# Patient Record
Sex: Female | Born: 1945 | Race: White | Hispanic: No | Marital: Married | Smoking: Former smoker
Health system: Southern US, Community
[De-identification: ages and names within clinical notes are randomized; demographics above are authoritative.]

## PROBLEM LIST (undated history)

## (undated) DIAGNOSIS — Z923 Personal history of irradiation: Secondary | ICD-10-CM

## (undated) DIAGNOSIS — Z1211 Encounter for screening for malignant neoplasm of colon: Secondary | ICD-10-CM

## (undated) DIAGNOSIS — L409 Psoriasis, unspecified: Secondary | ICD-10-CM

## (undated) DIAGNOSIS — J45909 Unspecified asthma, uncomplicated: Secondary | ICD-10-CM

## (undated) DIAGNOSIS — M810 Age-related osteoporosis without current pathological fracture: Secondary | ICD-10-CM

## (undated) DIAGNOSIS — I10 Essential (primary) hypertension: Secondary | ICD-10-CM

## (undated) DIAGNOSIS — M8580 Other specified disorders of bone density and structure, unspecified site: Secondary | ICD-10-CM

## (undated) DIAGNOSIS — E785 Hyperlipidemia, unspecified: Secondary | ICD-10-CM

## (undated) DIAGNOSIS — E119 Type 2 diabetes mellitus without complications: Secondary | ICD-10-CM

## (undated) DIAGNOSIS — C50919 Malignant neoplasm of unspecified site of unspecified female breast: Secondary | ICD-10-CM

## (undated) DIAGNOSIS — J449 Chronic obstructive pulmonary disease, unspecified: Secondary | ICD-10-CM

## (undated) HISTORY — DX: Chronic obstructive pulmonary disease, unspecified: J44.9

## (undated) HISTORY — DX: Unspecified asthma, uncomplicated: J45.909

## (undated) HISTORY — DX: Malignant neoplasm of unspecified site of unspecified female breast: C50.919

## (undated) HISTORY — DX: Encounter for screening for malignant neoplasm of colon: Z12.11

## (undated) HISTORY — PX: APPENDECTOMY: SHX54

## (undated) HISTORY — PX: ABDOMINAL HYSTERECTOMY: SHX81

## (undated) HISTORY — PX: BREAST BIOPSY: SHX20

## (undated) HISTORY — PX: TUBAL LIGATION: SHX77

## (undated) HISTORY — DX: Essential (primary) hypertension: I10

## (undated) HISTORY — DX: Hyperlipidemia, unspecified: E78.5

## (undated) HISTORY — DX: Type 2 diabetes mellitus without complications: E11.9

## (undated) HISTORY — DX: Psoriasis, unspecified: L40.9

## (undated) HISTORY — DX: Age-related osteoporosis without current pathological fracture: M81.0

## (undated) HISTORY — DX: Other specified disorders of bone density and structure, unspecified site: M85.80

---

## 1982-08-16 HISTORY — PX: CHOLECYSTECTOMY: SHX55

## 1982-08-16 HISTORY — PX: OTHER SURGICAL HISTORY: SHX169

## 2004-01-15 DIAGNOSIS — G500 Trigeminal neuralgia: Secondary | ICD-10-CM | POA: Insufficient documentation

## 2004-09-02 ENCOUNTER — Encounter: Payer: Self-pay | Admitting: Specialist

## 2004-09-15 ENCOUNTER — Ambulatory Visit: Payer: Self-pay | Admitting: Internal Medicine

## 2004-09-16 ENCOUNTER — Encounter: Payer: Self-pay | Admitting: Specialist

## 2004-10-14 ENCOUNTER — Encounter: Payer: Self-pay | Admitting: Specialist

## 2004-11-14 ENCOUNTER — Encounter: Payer: Self-pay | Admitting: Specialist

## 2004-12-14 ENCOUNTER — Encounter: Payer: Self-pay | Admitting: Specialist

## 2004-12-17 ENCOUNTER — Ambulatory Visit: Payer: Self-pay

## 2005-01-14 ENCOUNTER — Encounter: Payer: Self-pay | Admitting: Specialist

## 2005-01-19 ENCOUNTER — Ambulatory Visit: Payer: Self-pay | Admitting: Internal Medicine

## 2005-02-13 ENCOUNTER — Encounter: Payer: Self-pay | Admitting: Specialist

## 2005-03-16 ENCOUNTER — Encounter: Payer: Self-pay | Admitting: Specialist

## 2005-04-16 ENCOUNTER — Encounter: Payer: Self-pay | Admitting: Specialist

## 2005-05-28 ENCOUNTER — Ambulatory Visit: Payer: Self-pay | Admitting: Internal Medicine

## 2005-12-20 ENCOUNTER — Ambulatory Visit: Payer: Self-pay | Admitting: Internal Medicine

## 2006-01-17 ENCOUNTER — Ambulatory Visit: Payer: Self-pay | Admitting: Internal Medicine

## 2006-03-18 ENCOUNTER — Ambulatory Visit: Payer: Self-pay | Admitting: Gastroenterology

## 2006-04-27 ENCOUNTER — Ambulatory Visit: Payer: Self-pay | Admitting: Internal Medicine

## 2006-08-16 HISTORY — PX: COLONOSCOPY: SHX174

## 2006-11-08 ENCOUNTER — Ambulatory Visit: Payer: Self-pay | Admitting: Internal Medicine

## 2006-12-09 ENCOUNTER — Encounter: Payer: Self-pay | Admitting: General Practice

## 2006-12-15 ENCOUNTER — Encounter: Payer: Self-pay | Admitting: General Practice

## 2007-04-24 ENCOUNTER — Ambulatory Visit: Payer: Self-pay | Admitting: Unknown Physician Specialty

## 2007-08-30 ENCOUNTER — Ambulatory Visit: Payer: Self-pay | Admitting: Specialist

## 2007-11-08 ENCOUNTER — Ambulatory Visit: Payer: Self-pay | Admitting: Internal Medicine

## 2008-05-30 ENCOUNTER — Ambulatory Visit: Payer: Self-pay | Admitting: Internal Medicine

## 2008-06-11 ENCOUNTER — Ambulatory Visit: Payer: Self-pay | Admitting: Unknown Physician Specialty

## 2008-07-08 ENCOUNTER — Ambulatory Visit: Payer: Self-pay | Admitting: Internal Medicine

## 2008-07-22 ENCOUNTER — Ambulatory Visit: Payer: Self-pay | Admitting: Internal Medicine

## 2008-07-23 ENCOUNTER — Ambulatory Visit: Payer: Self-pay | Admitting: Internal Medicine

## 2008-07-26 ENCOUNTER — Ambulatory Visit: Payer: Self-pay | Admitting: Internal Medicine

## 2008-08-08 ENCOUNTER — Ambulatory Visit: Payer: Self-pay | Admitting: Internal Medicine

## 2008-09-09 ENCOUNTER — Ambulatory Visit: Payer: Self-pay | Admitting: Rheumatology

## 2008-09-09 ENCOUNTER — Ambulatory Visit: Payer: Self-pay | Admitting: Internal Medicine

## 2009-05-13 ENCOUNTER — Ambulatory Visit: Payer: Self-pay | Admitting: Internal Medicine

## 2009-08-20 ENCOUNTER — Ambulatory Visit: Payer: Self-pay | Admitting: Unknown Physician Specialty

## 2010-05-13 ENCOUNTER — Other Ambulatory Visit: Payer: Self-pay | Admitting: Internal Medicine

## 2010-07-28 ENCOUNTER — Ambulatory Visit: Payer: Self-pay | Admitting: Unknown Physician Specialty

## 2011-12-24 ENCOUNTER — Other Ambulatory Visit: Payer: Self-pay | Admitting: Internal Medicine

## 2011-12-24 LAB — CBC WITH DIFFERENTIAL/PLATELET
Basophil %: 0.8 %
Eosinophil %: 4.4 %
HCT: 38.4 % (ref 35.0–47.0)
HGB: 12.9 g/dL (ref 12.0–16.0)
Lymphocyte #: 1.8 10*3/uL (ref 1.0–3.6)
Lymphocyte %: 35.8 %
MCHC: 33.6 g/dL (ref 32.0–36.0)
Neutrophil %: 51.8 %
RBC: 3.98 10*6/uL (ref 3.80–5.20)
RDW: 13.4 % (ref 11.5–14.5)
WBC: 5.1 10*3/uL (ref 3.6–11.0)

## 2011-12-24 LAB — LIPID PANEL
Cholesterol: 171 mg/dL (ref 0–200)
Ldl Cholesterol, Calc: 92 mg/dL (ref 0–100)
Triglycerides: 188 mg/dL (ref 0–200)
VLDL Cholesterol, Calc: 38 mg/dL (ref 5–40)

## 2011-12-24 LAB — URINALYSIS, COMPLETE
Bacteria: NONE SEEN
Blood: NEGATIVE
Ketone: NEGATIVE
Leukocyte Esterase: NEGATIVE
Nitrite: NEGATIVE
Squamous Epithelial: <1

## 2011-12-24 LAB — COMPREHENSIVE METABOLIC PANEL
Albumin: 3.7 g/dL (ref 3.4–5.0)
BUN: 17 mg/dL (ref 7–18)
Chloride: 105 mmol/L (ref 98–107)
Co2: 30 mmol/L (ref 21–32)
EGFR (African American): >60
EGFR (Non-African Amer.): >60
SGOT(AST): 27 U/L (ref 15–37)
SGPT (ALT): 30 U/L
Sodium: 141 mmol/L (ref 136–145)

## 2011-12-24 LAB — BILIRUBIN, DIRECT: Bilirubin, Direct: <0.1 mg/dL (ref 0.00–0.20)

## 2013-01-18 ENCOUNTER — Ambulatory Visit: Payer: Self-pay | Admitting: Internal Medicine

## 2013-02-01 ENCOUNTER — Ambulatory Visit: Payer: Self-pay | Admitting: Internal Medicine

## 2013-02-13 ENCOUNTER — Ambulatory Visit: Payer: Self-pay | Admitting: Internal Medicine

## 2013-08-16 DIAGNOSIS — Z923 Personal history of irradiation: Secondary | ICD-10-CM

## 2013-08-16 HISTORY — PX: BREAST BIOPSY: SHX20

## 2013-08-16 HISTORY — DX: Personal history of irradiation: Z92.3

## 2013-08-16 HISTORY — PX: BREAST LUMPECTOMY: SHX2

## 2014-05-13 ENCOUNTER — Ambulatory Visit: Payer: Self-pay | Admitting: Internal Medicine

## 2014-05-16 ENCOUNTER — Ambulatory Visit: Payer: Self-pay | Admitting: Internal Medicine

## 2014-05-21 LAB — PULMONARY FUNCTION TEST

## 2014-05-23 ENCOUNTER — Other Ambulatory Visit: Payer: Self-pay | Admitting: General Surgery

## 2014-05-23 ENCOUNTER — Ambulatory Visit: Payer: Commercial Managed Care - PPO

## 2014-05-23 ENCOUNTER — Ambulatory Visit (INDEPENDENT_AMBULATORY_CARE_PROVIDER_SITE_OTHER): Payer: Commercial Managed Care - PPO | Admitting: General Surgery

## 2014-05-23 ENCOUNTER — Encounter: Payer: Self-pay | Admitting: General Surgery

## 2014-05-23 VITALS — BP 110/78 | HR 74 | Resp 14 | Ht 65.0 in | Wt 199.0 lb

## 2014-05-23 DIAGNOSIS — N6310 Unspecified lump in the right breast, unspecified quadrant: Secondary | ICD-10-CM

## 2014-05-23 DIAGNOSIS — R928 Other abnormal and inconclusive findings on diagnostic imaging of breast: Secondary | ICD-10-CM

## 2014-05-23 DIAGNOSIS — N63 Unspecified lump in breast: Secondary | ICD-10-CM

## 2014-05-23 NOTE — Patient Instructions (Signed)
Patient to return in 1 week for nurse check. Continue self breast exams. Call office for any new breast issues or concerns.     CARE AFTER BREAST BIOPSY  1. Leave the dressing on that your doctor applied after surgery. It is waterproof. You may bathe, shower and/or swim. The dressing will probably remain intact until your return office visit. If the dressing comes off, you will see small strips of tape against your skin on the incision. Do not remove these strips.  2. You may want to use a gauze,cloth or similar protection in your bra to prevent rubbing against your dressing and incision. This is not necessary, but you may feel more comfortable doing so.  3. It is recommended that you wear a bra day and night to give support to the breast. This will prevent the weight of the breast from pulling on the incision.  4. Your breast will feel hard and lumpy under the incision. Do not be alarmed. This is the underlying stitching of tissue. Softening of this tissue will occur in time.  5. Make sure you call the office and schedule an appointment in one week after your surgery. The office phone number is [phone]. The [facility] may have already done this for you.  6. You will notice about a week after your office visit that the strips of the tape on your incision will begin to loosen. These may then be removed.  7. Report to your doctor any of the following:  * Severe pain not relieved by your pain medication  *Redness of the incision  * Drainage from the incision  *Fever greater than 101 degrees

## 2014-05-23 NOTE — Progress Notes (Signed)
Patient ID: Laine DYANI BABEL, female   DOB: 12-Apr-1946, 68 y.o.   MRN: [id number]  Chief Complaint  Patient presents with  . Other    New Pt evaluation of right breast mass    HPI Rozina H Wieand is a 68 y.o. female who presents for an evaluation of an abnormal mammogram. Her most recent mammogram was performed on 05/16/14 as well as a right breast ultrasound. The patient performs self breast checks on occasion and her last mammogram prior to this one was done 4 years ago. (The patient reports that after 2011 mammograms she had decided against any additional studies until her PCP laid down the law earlier this year). She denies any problems with the breasts. She has had prior breast biopsies that were benign. The only known family history of breast cancer was a paternal grandmother.  The patient is accompanied today by Ranelle Oyster, RN, who works in the [facility] radiation oncology department.  HPI  Past Medical History  Diagnosis Date  . Hypertension   . Diabetes mellitus without complication   . Hyperlipidemia   . Osteopenia   . COPD (chronic obstructive pulmonary disease)   . Osteoporosis   . Asthma     Past Surgical History  Procedure Laterality Date  . Breast biopsy Bilateral 1980's  . Carpel tunnel Bilateral 1984  . Abdominal hysterectomy  1970's    partial  . Cholecystectomy  1984  . Tubal ligation      Family History  Problem Relation Age of Onset  . Pancreatitis Father   . Cancer Paternal Grandmother     breast    Social History History  Substance Use Topics  . Smoking status: Former Smoker -- 1.00 packs/day for 40 years  . Smokeless tobacco: Never Used  . Alcohol Use: No    Allergies  Allergen Reactions  . Other Anaphylaxis    "Yellow Dye"  . Codeine Nausea And Vomiting    Current Outpatient Prescriptions  Medication Sig Dispense Refill  . Calcium Carbonate-Vit D-Min (CALTRATE 600+D PLUS PO) Take by mouth.      . EPINEPHrine 0.3 mg/0.3 mL IJ SOAJ injection  Inject 0.3 mg into the muscle once.      . hydrochlorothiazide (HYDRODIURIL) 25 MG tablet Take 25 mg by mouth daily.      Marland Kitchen lovastatin (MEVACOR) 40 MG tablet Take 40 mg by mouth at bedtime.      . meloxicam (MOBIC) 15 MG tablet Take 15 mg by mouth daily as needed for pain.      . metFORMIN (GLUCOPHAGE) 500 MG tablet Take 500 mg by mouth 2 (two) times daily with a meal.      . metoprolol tartrate (LOPRESSOR) 25 MG tablet Take 25 mg by mouth 2 (two) times daily.      Marland Kitchen tiotropium (SPIRIVA) 18 MCG inhalation capsule Place 18 mcg into inhaler and inhale daily.      Marland Kitchen venlafaxine XR (EFFEXOR-XR) 150 MG 24 hr capsule Take 150 mg by mouth daily with breakfast.       No current facility-administered medications for this visit.    Review of Systems Review of Systems  Constitutional: Negative.   Respiratory: Negative.   Cardiovascular: Negative.     Blood pressure 110/78, pulse 74, resp. rate 14, height 5\' 5"  (1.651 m), weight 199 lb (90.266 kg).  Physical Exam Physical Exam  Constitutional: She is oriented to person, place, and time. She appears well-developed and well-nourished.  Neck: Neck supple. No thyromegaly present.  Cardiovascular: Normal rate, regular rhythm and normal heart sounds.   No murmur heard. Pulmonary/Chest: Effort normal and breath sounds normal. Right breast exhibits no inverted nipple, no mass, no nipple discharge, no skin change and no tenderness. Left breast exhibits no inverted nipple, no mass, no nipple discharge, no skin change and no tenderness.  Lymphadenopathy:    She has no cervical adenopathy.    She has no axillary adenopathy.  Neurological: She is alert and oriented to person, place, and time.  Skin: Skin is warm and dry.    Data Reviewed Screening mammogram dated 05/13/2014 suggested a mass in the right breast for which additional views and ultrasound were recommended.  Focal spot compression views and ultrasound dated 05/16/2014 showed a persistent  nodular density in the upper-outer quadrant of the right breast. A second dense in the 6:00 clear with compression and was felt unchanged. Ultrasound showed a hypoechoic irregular nodule corresponding to the mammographic findings. BI-RAD-4.  Review of the 2015 as well as 2011 mammogram suggests in retrospect a small area present in 2011 measuring 0.23 x 0.38 x 0.44. On mammogram in 2015 this area measured 0.46 x 0.50 x 0.72. Modest change in 4 years.  The patient was most interested in having an FNA. Considering the small size of lesion and the 5-10% likelihood of a false negative report I encouraged her to consider a vacuum biopsy. She was amenable to this.  Ultrasound examination showed a 0.34 x 0.28 x 0.42 hypoechoic nodule that was tall that was wide at the 9:00 position of the right breast 7 cm from the nipple. This corresponded to the [facility] study.  9 cc of 0.25% Marcaine with 0.5% Xylocaine with 1 200,000 units of epinephrine was utilized well tolerated. Chlor prep was applied to the skin. A 14-gauge Finesse device was passed under ultrasound guidance through the lesion. Pre-and post-fire images were obtained. Approximately 8 core samples were obtained. Scant bleeding was noted. A postbiopsy clip was placed. Skin defect was closed with benzoin Steri-Strips followed by Telfa and Tegaderm dressing.  Written instructions for wound care were provided.  Pulmonary function testing dated 05/21/2014 showed FVC of 81% of predicted, FEV1 and 62% of predicted. PFTs were obtained because of reports of mild increase in dyspnea with exertion. Negative catheterization May 2014.  PCP noticed it 04/09/2014 were reviewed. Assessment    Developing nodule upper-outer quadrant of the right breast.    Plan    The patient is aware that because of blade our biopsy results will likely not be available until October 12. She will be contacted when they are available.    PCP/Ref. MD: Arther Dames, Judene Companion 05/23/2014, 8:46 PM

## 2014-05-27 ENCOUNTER — Encounter: Payer: Self-pay | Admitting: General Surgery

## 2014-05-27 ENCOUNTER — Ambulatory Visit (INDEPENDENT_AMBULATORY_CARE_PROVIDER_SITE_OTHER): Payer: Commercial Managed Care - PPO | Admitting: General Surgery

## 2014-05-27 VITALS — BP 150/82 | HR 78 | Resp 16 | Ht 65.0 in | Wt 197.0 lb

## 2014-05-27 DIAGNOSIS — C50411 Malignant neoplasm of upper-outer quadrant of right female breast: Secondary | ICD-10-CM

## 2014-05-27 NOTE — Progress Notes (Signed)
Patient ID: Sandra Burnett, female   DOB: 01-26-1946, 68 y.o.   MRN: [id number]  Chief Complaint  Patient presents with  . Follow-up    breast discussion    HPI Sandra Burnett is a 68 y.o. female who presents for a breast discussion.   Patient here today for follow up post breast biopsy.  Dressing removed, steristrip in place and aware it may come off in one week.  Minimal bruising noted.  The patient is aware that a heating pad may be used for comfort as needed.  Aware of pathology. Follow up as scheduled.  HPI  Past Medical History  Diagnosis Date  . Hypertension   . Diabetes mellitus without complication   . Hyperlipidemia   . Osteopenia   . COPD (chronic obstructive pulmonary disease)   . Osteoporosis   . Asthma     Past Surgical History  Procedure Laterality Date  . Breast biopsy Bilateral 1980's  . Carpel tunnel Bilateral 1984  . Abdominal hysterectomy  1970's    partial  . Cholecystectomy  1984  . Tubal ligation      Family History  Problem Relation Age of Onset  . Pancreatitis Father   . Cancer Paternal Grandmother     breast    Social History History  Substance Use Topics  . Smoking status: Former Smoker -- 1.00 packs/day for 40 years  . Smokeless tobacco: Never Used  . Alcohol Use: No    Allergies  Allergen Reactions  . Other Anaphylaxis    "Yellow Dye"  . Codeine Nausea And Vomiting    Current Outpatient Prescriptions  Medication Sig Dispense Refill  . Calcium Carbonate-Vit D-Min (CALTRATE 600+D PLUS PO) Take by mouth.      . EPINEPHrine 0.3 mg/0.3 mL IJ SOAJ injection Inject 0.3 mg into the muscle once.      . hydrochlorothiazide (HYDRODIURIL) 25 MG tablet Take 25 mg by mouth daily.      Marland Kitchen lovastatin (MEVACOR) 40 MG tablet Take 40 mg by mouth at bedtime.      . meloxicam (MOBIC) 15 MG tablet Take 15 mg by mouth daily as needed for pain.      . metFORMIN (GLUCOPHAGE) 500 MG tablet Take 500 mg by mouth 2 (two) times daily with a meal.      .  metoprolol tartrate (LOPRESSOR) 25 MG tablet Take 25 mg by mouth 2 (two) times daily.      Marland Kitchen tiotropium (SPIRIVA) 18 MCG inhalation capsule Place 18 mcg into inhaler and inhale daily.      Marland Kitchen venlafaxine XR (EFFEXOR-XR) 150 MG 24 hr capsule Take 150 mg by mouth daily with breakfast.       No current facility-administered medications for this visit.    Review of Systems Review of Systems  Constitutional: Negative.   Respiratory: Negative.   Cardiovascular: Negative.     Blood pressure 150/82, pulse 78, resp. rate 16, height 5\' 5"  (1.651 m), weight 197 lb (89.359 kg).  Physical Exam Physical Exam  Data Reviewed A 6 mm invasive mammary carcinoma. No specific type. Receptor status pending.  Assessment    Stage I, T1b carcinoma of the right breast.     Plan    Options for management were reviewed in detail. Breast conservation and mastectomy were present it is therapeutic we will look procedures. The roll for post wide excision radiation therapy was discussed. Option for second surgical opinion were reviewed. An informational brochure was parotid. Proximally 45 minutes was spent  reviewing her surgical options. At this time she is leaning strongly toward breast conservation.     PCP/Ref Md: Ocie Cornfield 05/28/2014, 8:23 PM

## 2014-05-28 ENCOUNTER — Other Ambulatory Visit: Payer: Self-pay | Admitting: General Surgery

## 2014-05-28 ENCOUNTER — Telehealth: Payer: Self-pay

## 2014-05-28 DIAGNOSIS — Z170 Estrogen receptor positive status [ER+]: Secondary | ICD-10-CM

## 2014-05-28 DIAGNOSIS — C50411 Malignant neoplasm of upper-outer quadrant of right female breast: Secondary | ICD-10-CM | POA: Insufficient documentation

## 2014-05-28 NOTE — Telephone Encounter (Signed)
Spoke with patient about scheduling her breast surgery. Patient is scheduled for surgery at [facility] on 06/13/14. She will pre admit by phone on 06/04/14. She will report to the radiology desk on 06/13/14 at 7:45 am. Patient has been mailed her surgery instructions. She is aware of dates, time, and instructions.

## 2014-05-30 LAB — IMMUNOHISTOCHEMICAL STAIN(S)

## 2014-05-30 LAB — PATHOLOGY

## 2014-06-06 ENCOUNTER — Ambulatory Visit: Payer: Self-pay | Admitting: Anesthesiology

## 2014-06-06 LAB — BASIC METABOLIC PANEL
ANION GAP: 6 — AB (ref 7–16)
BUN: 20 mg/dL — ABNORMAL HIGH (ref 7–18)
CALCIUM: 9.1 mg/dL (ref 8.5–10.1)
CREATININE: 0.86 mg/dL (ref 0.60–1.30)
Chloride: 103 mmol/L (ref 98–107)
Co2: 30 mmol/L (ref 21–32)
EGFR (Non-African Amer.): >60
GLUCOSE: 184 mg/dL — AB (ref 65–99)
Osmolality: 285 (ref 275–301)
Potassium: 3.3 mmol/L — ABNORMAL LOW (ref 3.5–5.1)
Sodium: 139 mmol/L (ref 136–145)

## 2014-06-08 ENCOUNTER — Telehealth: Payer: Self-pay | Admitting: General Surgery

## 2014-06-08 NOTE — Telephone Encounter (Signed)
Mildly low potassium. Will supplement with KCl 10 mEq tablets 1 by mouth twice a day. RX is called to [facility] per patient request.  The patient report she says and right lower quadrant pain the last few days. No fever or chills. No change in appetite or diet. Noted this after discontinuing Mobic per hospital instructions. Reports she's had trouble with fried foods in the past (status post cholecystectomy 1984). When she's had episodes she is attributed this to diverticulitis. Will examine at time of her October 26 office visit, she will call earlier if symptoms worsen.

## 2014-06-10 ENCOUNTER — Ambulatory Visit: Payer: Commercial Managed Care - PPO

## 2014-06-10 ENCOUNTER — Ambulatory Visit (INDEPENDENT_AMBULATORY_CARE_PROVIDER_SITE_OTHER): Payer: Commercial Managed Care - PPO | Admitting: General Surgery

## 2014-06-10 ENCOUNTER — Encounter: Payer: Self-pay | Admitting: General Surgery

## 2014-06-10 VITALS — BP 140/80 | HR 72 | Resp 14 | Ht 65.0 in | Wt 198.0 lb

## 2014-06-10 DIAGNOSIS — G8929 Other chronic pain: Secondary | ICD-10-CM | POA: Insufficient documentation

## 2014-06-10 DIAGNOSIS — C50411 Malignant neoplasm of upper-outer quadrant of right female breast: Secondary | ICD-10-CM

## 2014-06-10 DIAGNOSIS — E876 Hypokalemia: Secondary | ICD-10-CM

## 2014-06-10 DIAGNOSIS — R1031 Right lower quadrant pain: Secondary | ICD-10-CM

## 2014-06-10 NOTE — Patient Instructions (Signed)
The patient is aware to call back for any questions or concerns.  

## 2014-06-10 NOTE — Progress Notes (Signed)
Patient ID: Sandra Burnett, female   DOB: Dec 24, 1945, 68 y.o.   MRN: [id number]  Chief Complaint  Patient presents with  . Follow-up    Right breast ultrasound for pre surgery    HPI Sandra Burnett is a 68 y.o. female who presents for a right breast ultrasound for pre surgery. The patient is doing well. No new complaints at this time.   The patient complains of right lower quadrant pain that started approximately 2 weeks ago. This has been in the lower abdomen extending from near the anterior superior iliac spine towards the midline above the level of the inguinal ligament. It has not been associated with nausea, vomiting or diarrhea. No change in bladder function. She reports the symptoms are similar to what she experienced years ago with ovarian cysts. A last few days it has significantly improved. She is experienced no fever or chills associated with this discomfort. It is intermittent in nature.  HPI  Past Medical History  Diagnosis Date  . Hypertension   . Diabetes mellitus without complication   . Hyperlipidemia   . Osteopenia   . COPD (chronic obstructive pulmonary disease)   . Osteoporosis   . Asthma     Past Surgical History  Procedure Laterality Date  . Breast biopsy Bilateral 1980's  . Carpel tunnel Bilateral 1984  . Abdominal hysterectomy  1970's    partial  . Cholecystectomy  1984  . Tubal ligation      Family History  Problem Relation Age of Onset  . Pancreatitis Father   . Cancer Paternal Grandmother     breast    Social History History  Substance Use Topics  . Smoking status: Former Smoker -- 1.00 packs/day for 40 years  . Smokeless tobacco: Never Used  . Alcohol Use: No    Allergies  Allergen Reactions  . Other Anaphylaxis    "Yellow Dye"  . Codeine Nausea And Vomiting    Current Outpatient Prescriptions  Medication Sig Dispense Refill  . Calcium Carbonate-Vit D-Min (CALTRATE 600+D PLUS PO) Take by mouth.      . EPINEPHrine 0.3 mg/0.3 mL IJ  SOAJ injection Inject 0.3 mg into the muscle once.      . hydrochlorothiazide (HYDRODIURIL) 25 MG tablet Take 25 mg by mouth daily.      Marland Kitchen lovastatin (MEVACOR) 40 MG tablet Take 40 mg by mouth at bedtime.      . meloxicam (MOBIC) 15 MG tablet Take 15 mg by mouth daily as needed for pain.      . metFORMIN (GLUCOPHAGE) 500 MG tablet Take 500 mg by mouth 2 (two) times daily with a meal.      . metoprolol tartrate (LOPRESSOR) 25 MG tablet Take 25 mg by mouth 2 (two) times daily.      . potassium chloride (K-DUR) 10 MEQ tablet Take 10 mEq by mouth daily.      Marland Kitchen tiotropium (SPIRIVA) 18 MCG inhalation capsule Place 18 mcg into inhaler and inhale daily.      Marland Kitchen venlafaxine XR (EFFEXOR-XR) 150 MG 24 hr capsule Take 150 mg by mouth daily with breakfast.       No current facility-administered medications for this visit.    Review of Systems Review of Systems  Constitutional: Negative.   Respiratory: Negative.   Cardiovascular: Negative.     Blood pressure 140/80, pulse 72, resp. rate 14, height 5\' 5"  (1.651 m), weight 198 lb (89.812 kg).  Physical Exam Physical Exam  Constitutional: She is oriented  to person, place, and time. She appears well-developed and well-nourished.  Cardiovascular: Normal rate, regular rhythm and normal heart sounds.   No murmur heard. Pulmonary/Chest: Effort normal and breath sounds normal.    Abdominal: Soft. Normal appearance and bowel sounds are normal. There is no hepatosplenomegaly. There is no tenderness. No hernia.    Neurological: She is alert and oriented to person, place, and time.  Skin: Skin is warm and dry.    Data Reviewed Ultrasound examination of the right breast was undertaken to confirm the previously identified site of malignancy can be localized. At the 9:00 position of the right breast 8 cm from the nipple a hypoechoic mass with dense shadowing measuring 0.5 x 0.81 x 0.86 cm is identified.  Scanning to the axilla showed no adenopathy.  BI-RADS-6.  Assessment    Right breast cancer, stage I. Desires breast conservation.  Right lower quadrant pain, no clear etiology.  Mild hypokalemia (3.2) noted on preoperative testing. 10 mEq KCl tablets twice a day initiated 2 days ago.    Plan    Plans for breast conservation were reviewed. Potential for partial breast radiation briefly touched upon.  We'll postpone any further investigation of the right lower quadrant pain as she has shown significant improvement over the last 48-72 hours.  We will plan to proceed with wide excision and sentinel node biopsy on 1029.     PCP/Ref MD: Arther Dames, Trecia Rogers 06/10/2014, 8:27 PM

## 2014-06-13 ENCOUNTER — Encounter: Payer: Self-pay | Admitting: General Surgery

## 2014-06-13 ENCOUNTER — Ambulatory Visit: Payer: Self-pay | Admitting: General Surgery

## 2014-06-13 DIAGNOSIS — C50411 Malignant neoplasm of upper-outer quadrant of right female breast: Secondary | ICD-10-CM

## 2014-06-13 HISTORY — PX: BREAST SURGERY: SHX581

## 2014-06-17 ENCOUNTER — Encounter: Payer: Self-pay | Admitting: General Surgery

## 2014-06-17 ENCOUNTER — Telehealth: Payer: Self-pay | Admitting: General Surgery

## 2014-06-17 NOTE — Telephone Encounter (Signed)
Notified margins and SLN clear.  Reports significant pain first few days, now improving. Follow up on November 4th as planned.

## 2014-06-18 ENCOUNTER — Encounter: Payer: Self-pay | Admitting: General Surgery

## 2014-06-18 ENCOUNTER — Ambulatory Visit: Payer: Commercial Managed Care - PPO | Admitting: General Surgery

## 2014-06-18 ENCOUNTER — Other Ambulatory Visit (INDEPENDENT_AMBULATORY_CARE_PROVIDER_SITE_OTHER): Payer: Commercial Managed Care - PPO

## 2014-06-18 VITALS — BP 114/78 | HR 76 | Temp 98.5°F | Resp 14 | Ht 65.0 in | Wt 201.0 lb

## 2014-06-18 DIAGNOSIS — C50411 Malignant neoplasm of upper-outer quadrant of right female breast: Secondary | ICD-10-CM

## 2014-06-18 DIAGNOSIS — T888XXA Other specified complications of surgical and medical care, not elsewhere classified, initial encounter: Secondary | ICD-10-CM

## 2014-06-18 DIAGNOSIS — T792XXA Traumatic secondary and recurrent hemorrhage and seroma, initial encounter: Secondary | ICD-10-CM

## 2014-06-18 DIAGNOSIS — IMO0002 Reserved for concepts with insufficient information to code with codable children: Secondary | ICD-10-CM | POA: Insufficient documentation

## 2014-06-18 DIAGNOSIS — IMO0001 Reserved for inherently not codable concepts without codable children: Secondary | ICD-10-CM

## 2014-06-18 MED ORDER — SULFAMETHOXAZOLE-TRIMETHOPRIM 800-160 MG PO TABS: 1.0000 | ORAL_TABLET | Freq: Two times a day (BID) | ORAL | Status: DC

## 2014-06-18 NOTE — Progress Notes (Signed)
Patient ID: Sandra Burnett, female   DOB: 1946-07-03, 68 y.o.   MRN: [id number]  Chief Complaint  Patient presents with  . Routine Post Op    HPI Sandra Burnett is a 68 y.o. female here today for her post op right breast wide excision done on 06/13/14. Patient states the area is red and swollen. She states she had fever 99.5 Saturday. She also states that her blood sugar was in the 200's this weekend. Redness is less today than noted over the weekend by Ranelle Oyster, RN from Radiation Oncology who accompanied the patient today.   HPI  Past Medical History  Diagnosis Date  . Hypertension   . Diabetes mellitus without complication   . Hyperlipidemia   . Osteopenia   . COPD (chronic obstructive pulmonary disease)   . Osteoporosis   . Asthma     Past Surgical History  Procedure Laterality Date  . Carpel tunnel Bilateral 1984  . Abdominal hysterectomy  1970's    partial  . Cholecystectomy  1984  . Tubal ligation    . Breast biopsy Bilateral 1980's  . Breast surgery Right 06/13/14    right breast wide excision    Family History  Problem Relation Age of Onset  . Pancreatitis Father   . Cancer Paternal Grandmother     breast    Social History History  Substance Use Topics  . Smoking status: Former Smoker -- 1.00 packs/day for 40 years  . Smokeless tobacco: Never Used  . Alcohol Use: No    Allergies  Allergen Reactions  . Other Anaphylaxis    "Yellow Dye"  . Codeine Nausea And Vomiting    Current Outpatient Prescriptions  Medication Sig Dispense Refill  . Calcium Carbonate-Vit D-Min (CALTRATE 600+D PLUS PO) Take by mouth.    . EPINEPHrine 0.3 mg/0.3 mL IJ SOAJ injection Inject 0.3 mg into the muscle once.    . hydrochlorothiazide (HYDRODIURIL) 25 MG tablet Take 25 mg by mouth daily.    Marland Kitchen HYDROcodone-acetaminophen (NORCO/VICODIN) 5-325 MG per tablet Take 2 tablets by mouth every 6 (six) hours as needed.   0  . lovastatin (MEVACOR) 40 MG tablet Take 40 mg by mouth at  bedtime.    . meloxicam (MOBIC) 15 MG tablet Take 15 mg by mouth daily as needed for pain.    . metFORMIN (GLUCOPHAGE) 500 MG tablet Take 500 mg by mouth 2 (two) times daily with a meal.    . metoprolol tartrate (LOPRESSOR) 25 MG tablet Take 25 mg by mouth 2 (two) times daily.    . potassium chloride (K-DUR) 10 MEQ tablet Take 10 mEq by mouth daily.    Marland Kitchen tiotropium (SPIRIVA) 18 MCG inhalation capsule Place 18 mcg into inhaler and inhale daily.    Marland Kitchen venlafaxine XR (EFFEXOR-XR) 150 MG 24 hr capsule Take 150 mg by mouth daily with breakfast.    . sulfamethoxazole-trimethoprim (BACTRIM DS,SEPTRA DS) 800-160 MG per tablet Take 1 tablet by mouth 2 (two) times daily. 20 tablet 0   No current facility-administered medications for this visit.    Review of Systems Review of Systems  Blood pressure 114/78, pulse 76, temperature 98.5 F (36.9 C), temperature source Oral, resp. rate 14, height 5\' 5"  (1.651 m), weight 201 lb (91.173 kg).  Physical Exam Physical Exam The right breast shows minimal warmth in area of mastoplasty. Minimal redness secondary to flap elevation.  Data Reviewed Margins negative. Nodes negative.  Ultrasound shows a 2.5 x 3. 6.5 cm seroma. Aspiration  returned 60 cc brown fluid consistent with blood/ seroma. Culture obtained.   Assessment    Seroma, possible infection.    Plan    The patient will be placed on Bactrim DS, 1 po BID pending culture results.  Follow up one week.  She will arrange for radiation oncology assessment prior to her next appointment.       PCP:  Earvin Hansen 06/18/2014, 8:36 PM

## 2014-06-18 NOTE — Patient Instructions (Signed)
Continue self breast exams. Call office for any new breast issues or concerns. 

## 2014-06-19 ENCOUNTER — Ambulatory Visit: Payer: Commercial Managed Care - PPO | Admitting: General Surgery

## 2014-06-20 ENCOUNTER — Telehealth: Payer: Self-pay | Admitting: *Deleted

## 2014-06-20 MED ORDER — CEFADROXIL 500 MG PO CAPS: 500.0000 mg | ORAL_CAPSULE | Freq: Two times a day (BID) | ORAL | Status: DC

## 2014-06-20 NOTE — Telephone Encounter (Signed)
Mammosite schedule reviewed with the patient Placement   06-25-14  11:45  at [facility] Scan 06-28-14 Treat 11-16 to 07-05-14 Aware Abigail Butts will be calling her for more details Aware of ATB and directions reviewed. Aware no showers and to wear her bra while mammosite in place. Pt agrees.

## 2014-06-21 ENCOUNTER — Ambulatory Visit: Payer: Self-pay | Admitting: Radiation Oncology

## 2014-06-23 LAB — ANAEROBIC AND AEROBIC CULTURE

## 2014-06-25 ENCOUNTER — Ambulatory Visit: Payer: Commercial Managed Care - PPO | Admitting: General Surgery

## 2014-06-25 ENCOUNTER — Other Ambulatory Visit: Payer: Commercial Managed Care - PPO

## 2014-06-25 ENCOUNTER — Ambulatory Visit (INDEPENDENT_AMBULATORY_CARE_PROVIDER_SITE_OTHER): Payer: Commercial Managed Care - PPO | Admitting: General Surgery

## 2014-06-25 ENCOUNTER — Encounter: Payer: Self-pay | Admitting: General Surgery

## 2014-06-25 VITALS — BP 136/74 | HR 74 | Resp 12 | Ht 65.0 in | Wt 199.0 lb

## 2014-06-25 DIAGNOSIS — C50411 Malignant neoplasm of upper-outer quadrant of right female breast: Secondary | ICD-10-CM

## 2014-06-25 NOTE — Progress Notes (Signed)
Patient ID: Lashanti EVANN KOELZER, female   DOB: 1946/06/26, 68 y.o.   MRN: [id number]  Chief Complaint  Patient presents with  . Procedure    right mammosite    HPI Lashawn ANALYSSE QUINONEZ is a 68 y.o. female here today for a right mammosite placement. The patient has met with Carmina Miller, MD from radiation oncology and desires to proceed with partial breast radiation. The previously noted restin the surgical site prior to aspiration at her last visit has significantly improved. Only mild tenderness persists in the breasts. HPI  Past Medical History  Diagnosis Date  . Hypertension   . Diabetes mellitus without complication   . Hyperlipidemia   . Osteopenia   . COPD (chronic obstructive pulmonary disease)   . Osteoporosis   . Asthma     Past Surgical History  Procedure Laterality Date  . Carpel tunnel Bilateral 1984  . Abdominal hysterectomy  1970's    partial  . Cholecystectomy  1984  . Tubal ligation    . Breast biopsy Bilateral 1980's  . Breast surgery Right 06/13/14    right breast wide excision    Family History  Problem Relation Age of Onset  . Pancreatitis Father   . Cancer Paternal Grandmother     breast    Social History History  Substance Use Topics  . Smoking status: Former Smoker -- 1.00 packs/day for 40 years  . Smokeless tobacco: Never Used  . Alcohol Use: No    Allergies  Allergen Reactions  . Other Anaphylaxis    "Yellow Dye"  . Codeine Nausea And Vomiting    Current Outpatient Prescriptions  Medication Sig Dispense Refill  . Calcium Carbonate-Vit D-Min (CALTRATE 600+D PLUS PO) Take by mouth.    . cefadroxil (DURICEF) 500 MG capsule Take 1 capsule (500 mg total) by mouth 2 (two) times daily. 22 capsule 0  . EPINEPHrine 0.3 mg/0.3 mL IJ SOAJ injection Inject 0.3 mg into the muscle once.    . hydrochlorothiazide (HYDRODIURIL) 25 MG tablet Take 25 mg by mouth daily.    Marland Kitchen HYDROcodone-acetaminophen (NORCO/VICODIN) 5-325 MG per tablet Take 2 tablets by mouth  every 6 (six) hours as needed.   0  . lovastatin (MEVACOR) 40 MG tablet Take 40 mg by mouth at bedtime.    . meloxicam (MOBIC) 15 MG tablet Take 15 mg by mouth daily as needed for pain.    . metFORMIN (GLUCOPHAGE) 500 MG tablet Take 500 mg by mouth 2 (two) times daily with a meal.    . metoprolol tartrate (LOPRESSOR) 25 MG tablet Take 25 mg by mouth 2 (two) times daily.    . potassium chloride (K-DUR) 10 MEQ tablet Take 10 mEq by mouth daily.    Marland Kitchen sulfamethoxazole-trimethoprim (BACTRIM DS,SEPTRA DS) 800-160 MG per tablet Take 1 tablet by mouth 2 (two) times daily. 20 tablet 0  . tiotropium (SPIRIVA) 18 MCG inhalation capsule Place 18 mcg into inhaler and inhale daily.    Marland Kitchen venlafaxine XR (EFFEXOR-XR) 150 MG 24 hr capsule Take 150 mg by mouth daily with breakfast.     No current facility-administered medications for this visit.    Review of Systems Review of Systems  Constitutional: Negative.   Respiratory: Negative.   Cardiovascular: Negative.     Blood pressure 136/74, pulse 74, resp. rate 12, height 5\' 5"  (1.651 m), weight 199 lb (90.266 kg).  Physical Exam Physical Exam Near complete resolution of the previously identified erythema. No induration. Incisions healing well.  Data Reviewed Culture  obtained at last visit with no growth at 48 hours. The patient has continued Bactrim as requested for planned MammoSite balloon placement today.  Assessment    Candidate for partial breast radiation.     Plan    Ultrasound examination again showed an adequate cavity approximately 1.5 cm below the skin surface. The breast was prepped with ChloraPrep and 10 mL of 0.5% Xylocaine with 0.25% Marcaine with 1-200,000 epinephrine was utilized well tolerated. ChloraPrep was again applied to the skin and the area draped. A small incision was made laterally followed by placement of an 8 mm trocar. Approximately 30 mL of old seroma fluid drained. No odor.  The cavity evaluation device was inserted  and good space preservation noted without evidence of additional residual seroma. This was then removed and the treatment balloon placed and inflated to 50 mL with a mixture of saline and Omnipaque. This was well-tolerated. The minimal skin distance post inflation was 1.49 cm. The balloon was a spherical both pre-and post implantation. The exit site was treated with bacitracin ointment followed by dry dressing.  The patient was instructed in regards to postplacement wound care. Simulation is scheduled for November 13 and treatment through the week of November 16.  We'll plan for follow-up examination here in 3 weeks.        Robert Bellow 06/25/2014, 8:35 PM

## 2014-06-25 NOTE — Patient Instructions (Signed)
Patient care kit given to patient.  Instructed no showers, sponge bath while mammosite in place, take antibiotic. Follow up with [facility] as arranged. Discussed wearing your bra for support at all times. 

## 2014-07-16 ENCOUNTER — Ambulatory Visit: Payer: Self-pay | Admitting: Oncology

## 2014-07-18 ENCOUNTER — Encounter: Payer: Self-pay | Admitting: General Surgery

## 2014-07-18 ENCOUNTER — Ambulatory Visit (INDEPENDENT_AMBULATORY_CARE_PROVIDER_SITE_OTHER): Payer: Self-pay | Admitting: General Surgery

## 2014-07-18 VITALS — BP 130/74 | HR 74 | Resp 12 | Ht 65.0 in | Wt 203.0 lb

## 2014-07-18 DIAGNOSIS — C50411 Malignant neoplasm of upper-outer quadrant of right female breast: Secondary | ICD-10-CM

## 2014-07-18 NOTE — Patient Instructions (Signed)
Patient to return in April 2016 for breast check.

## 2014-07-18 NOTE — Progress Notes (Signed)
Patient ID: Sandra Burnett, female   DOB: 12-04-1945, 68 y.o.   MRN: [id number]  Chief Complaint  Patient presents with  . Follow-up    mammosite    HPI Sandra Burnett is a 68 y.o. female here today for her 3 week follow up mammosite. Patient states she is doing well.   She tolerated her partial breast radiation well. She had noticed some fullness post balloon removal which she reports is improving. HPI  Past Medical History  Diagnosis Date  . Hypertension   . Diabetes mellitus without complication   . Hyperlipidemia   . Osteopenia   . COPD (chronic obstructive pulmonary disease)   . Osteoporosis   . Asthma     Past Surgical History  Procedure Laterality Date  . Carpel tunnel Bilateral 1984  . Abdominal hysterectomy  1970's    partial  . Cholecystectomy  1984  . Tubal ligation    . Breast biopsy Bilateral 1980's  . Breast surgery Right 06/13/14    right breast wide excision    Family History  Problem Relation Age of Onset  . Pancreatitis Father   . Cancer Paternal Grandmother     breast    Social History History  Substance Use Topics  . Smoking status: Former Smoker -- 1.00 packs/day for 40 years  . Smokeless tobacco: Never Used  . Alcohol Use: No    Allergies  Allergen Reactions  . Other Anaphylaxis    "Yellow Dye"  . Codeine Nausea And Vomiting    Current Outpatient Prescriptions  Medication Sig Dispense Refill  . Calcium Carbonate-Vit D-Min (CALTRATE 600+D PLUS PO) Take by mouth.    . cefadroxil (DURICEF) 500 MG capsule Take 1 capsule (500 mg total) by mouth 2 (two) times daily. 22 capsule 0  . EPINEPHrine 0.3 mg/0.3 mL IJ SOAJ injection Inject 0.3 mg into the muscle once.    . hydrochlorothiazide (HYDRODIURIL) 25 MG tablet Take 25 mg by mouth daily.    Marland Kitchen HYDROcodone-acetaminophen (NORCO/VICODIN) 5-325 MG per tablet Take 2 tablets by mouth every 6 (six) hours as needed.   0  . lovastatin (MEVACOR) 40 MG tablet Take 40 mg by mouth at bedtime.    .  meloxicam (MOBIC) 15 MG tablet Take 15 mg by mouth daily as needed for pain.    . metFORMIN (GLUCOPHAGE) 500 MG tablet Take 500 mg by mouth 2 (two) times daily with a meal.    . metoprolol tartrate (LOPRESSOR) 25 MG tablet Take 25 mg by mouth 2 (two) times daily.    . potassium chloride (K-DUR) 10 MEQ tablet Take 10 mEq by mouth daily.    Marland Kitchen sulfamethoxazole-trimethoprim (BACTRIM DS,SEPTRA DS) 800-160 MG per tablet Take 1 tablet by mouth 2 (two) times daily. 20 tablet 0  . tiotropium (SPIRIVA) 18 MCG inhalation capsule Place 18 mcg into inhaler and inhale daily.    Marland Kitchen venlafaxine XR (EFFEXOR-XR) 150 MG 24 hr capsule Take 150 mg by mouth daily with breakfast.     No current facility-administered medications for this visit.    Review of Systems Review of Systems  Constitutional: Negative.   Respiratory: Negative.   Cardiovascular: Negative.     Blood pressure 130/74, pulse 74, resp. rate 12, height 5\' 5"  (1.651 m), weight 203 lb (92.08 kg).  Physical Exam Physical Exam  Constitutional: She is oriented to person, place, and time. She appears well-developed and well-nourished.  Eyes: Conjunctivae are normal. No scleral icterus.  Neck: Neck supple.  Cardiovascular: Normal rate,  regular rhythm and normal heart sounds.   Pulmonary/Chest: Effort normal and breath sounds normal.    Right breast incision looks clean and healing well.   Neurological: She is alert and oriented to person, place, and time.  Skin: Skin is dry.    Data Reviewed Ultrasound examination of the wide excision site showed a seroma cavity measuring 1.0 x 2.4 x 4.0 cm. The patient was amenable to aspiration. This was completed using 1 mL of Xylocaine after ChloraPrep application. 5 mL of clear fluid was obtained with complete resolution of the seroma. Modest improvement in the fullness noted in the breast.  Assessment    Post excision/radiation changes, minimal residual seroma.    Plan    The patient has been  encouraged to make use of local heat to the breast to improve the residual swelling. Care with heating pad use was encouraged.   Patient to return  In April 2016 for a breast check.     Robert Bellow 07/18/2014, 9:30 PM

## 2014-08-15 ENCOUNTER — Encounter: Payer: Self-pay | Admitting: General Surgery

## 2014-08-15 ENCOUNTER — Ambulatory Visit (INDEPENDENT_AMBULATORY_CARE_PROVIDER_SITE_OTHER): Payer: Commercial Managed Care - PPO | Admitting: General Surgery

## 2014-08-15 VITALS — BP 130/72 | HR 78 | Resp 14 | Ht 65.0 in | Wt 203.0 lb

## 2014-08-15 DIAGNOSIS — L97909 Non-pressure chronic ulcer of unspecified part of unspecified lower leg with unspecified severity: Secondary | ICD-10-CM | POA: Insufficient documentation

## 2014-08-15 DIAGNOSIS — L97919 Non-pressure chronic ulcer of unspecified part of right lower leg with unspecified severity: Secondary | ICD-10-CM

## 2014-08-15 MED ORDER — SILVER SULFADIAZINE 1 % EX CREA: TOPICAL_CREAM | CUTANEOUS | Status: DC

## 2014-08-15 NOTE — Patient Instructions (Signed)
The patient is aware to call back for any questions or concerns.  

## 2014-08-15 NOTE — Progress Notes (Signed)
Patient ID: Sandra Burnett, female   DOB: 1946/02/21, 68 y.o.   MRN: [id number]  Chief Complaint  Patient presents with  . Other    leg ulcer    HPI Sandra Burnett is a 68 y.o. female here today for a evaluation of a leg ulcer. She states this area started approximately 1 month ago. The area is red and scabbed over area where the blister was.  The patient was placed on Bactrim in early November when she developed some faint redness at her breast wide excision site.  Cultures were negative.  HPI  Past Medical History  Diagnosis Date  . Hypertension   . Diabetes mellitus without complication   . Hyperlipidemia   . Osteopenia   . COPD (chronic obstructive pulmonary disease)   . Osteoporosis   . Asthma     Past Surgical History  Procedure Laterality Date  . Carpel tunnel Bilateral 1984  . Abdominal hysterectomy  1970's    partial  . Cholecystectomy  1984  . Tubal ligation    . Breast biopsy Bilateral 1980's  . Breast surgery Right 06/13/14    right breast wide excision    Family History  Problem Relation Age of Onset  . Pancreatitis Father   . Cancer Paternal Grandmother     breast    Social History History  Substance Use Topics  . Smoking status: Former Smoker -- 1.00 packs/day for 40 years  . Smokeless tobacco: Never Used  . Alcohol Use: No    Allergies  Allergen Reactions  . Other Anaphylaxis    "Yellow Dye"  . Codeine Nausea And Vomiting    Current Outpatient Prescriptions  Medication Sig Dispense Refill  . Calcium Carbonate-Vit D-Min (CALTRATE 600+D PLUS PO) Take by mouth.    . cephALEXin (KEFLEX) 500 MG capsule Take 500 mg by mouth 2 (two) times daily.    Marland Kitchen EPINEPHrine 0.3 mg/0.3 mL IJ SOAJ injection Inject 0.3 mg into the muscle once.    . hydrochlorothiazide (HYDRODIURIL) 25 MG tablet Take 25 mg by mouth daily.    Marland Kitchen lovastatin (MEVACOR) 40 MG tablet Take 40 mg by mouth at bedtime.    . metFORMIN (GLUCOPHAGE) 500 MG tablet Take 500 mg by mouth 2 (two)  times daily with a meal.    . metoprolol tartrate (LOPRESSOR) 25 MG tablet Take 25 mg by mouth 2 (two) times daily.    Marland Kitchen tiotropium (SPIRIVA) 18 MCG inhalation capsule Place 18 mcg into inhaler and inhale daily.    Marland Kitchen venlafaxine XR (EFFEXOR-XR) 150 MG 24 hr capsule Take 150 mg by mouth daily with breakfast.    . silver sulfADIAZINE (SILVADENE) 1 % cream Apply to affected area daily 50 g 1   No current facility-administered medications for this visit.    Review of Systems Review of Systems  Constitutional: Negative.   Respiratory: Negative.   Cardiovascular: Negative.     Blood pressure 130/72, pulse 78, resp. rate 14, height 5\' 5"  (1.651 m), weight 203 lb (92.08 kg).  Physical Exam Physical Exam  Cardiovascular: Normal pulses.   Musculoskeletal:       Legs: Skin:  1 cm area of errythema with ulceration on the right lateral leg.       Assessment    Superficial skin ulceration of the right lower extremity, no clear etiology.    Plan    The patient will make use of Silvadene cream with a thin film to be applied twice a day-3 times a day.  She'll give a phone report in 2 weeks if she has failed to show improvement.     PCP:  Arther Dames,  RefMD: Dr. Marvetta Gibbons, [place] Gleason 08/15/2014, 12:12 PM

## 2014-08-16 ENCOUNTER — Ambulatory Visit: Payer: Self-pay | Admitting: Oncology

## 2014-10-02 ENCOUNTER — Ambulatory Visit (INDEPENDENT_AMBULATORY_CARE_PROVIDER_SITE_OTHER): Payer: Commercial Managed Care - PPO | Admitting: General Surgery

## 2014-10-02 ENCOUNTER — Encounter: Payer: Self-pay | Admitting: General Surgery

## 2014-10-02 ENCOUNTER — Other Ambulatory Visit: Payer: Commercial Managed Care - PPO

## 2014-10-02 VITALS — BP 138/78 | HR 80 | Resp 16 | Ht 65.0 in | Wt 200.0 lb

## 2014-10-02 DIAGNOSIS — G8929 Other chronic pain: Secondary | ICD-10-CM

## 2014-10-02 DIAGNOSIS — N6310 Unspecified lump in the right breast, unspecified quadrant: Secondary | ICD-10-CM

## 2014-10-02 DIAGNOSIS — N63 Unspecified lump in breast: Secondary | ICD-10-CM

## 2014-10-02 DIAGNOSIS — R1031 Right lower quadrant pain: Secondary | ICD-10-CM | POA: Diagnosis not present

## 2014-10-02 NOTE — Progress Notes (Signed)
Patient ID: Sandra Burnett, female   DOB: [birth year], 69 y.o.   MRN: [id number]  Chief Complaint  Patient presents with  . Abdominal Pain  . Breast Problem    HPI Sandra Burnett is a 69 y.o. female.  Here today for evaluation of right breast lump that she noticed 3-4 weeks ago. She states may or may not have changed in size. Denies pain to that area.  She is also having abdominal pain. The pain is in the right lower quadrant. She noticed it last October. Over the past 2 weeks it has gotten worse. The pain is worse when she stands up. Direct pressure to the area does not necessarily exacerbate her symptoms. She is not aware of any bulge or mass in the area.  Mild nausea but no vomiting. Bowels are regular. She did have an ultrasound back in Oct at the GYN ( Dr. Defrancesco)and it was normal. She feels it maybe adhesions because when she had her colonoscopy they manipulated her colon and the pain is similar.  The patient reports that she was told and appendectomy was completed at the time of her open cholecystectomy in 1984.   HPI  Past Medical History  Diagnosis Date  . Hypertension   . Diabetes mellitus without complication   . Hyperlipidemia   . Osteopenia   . COPD (chronic obstructive pulmonary disease)   . Osteoporosis   . Asthma     Past Surgical History  Procedure Laterality Date  . Carpel tunnel Bilateral 1984  . Abdominal hysterectomy  1970's    partial  . Cholecystectomy  1984  . Tubal ligation    . Breast biopsy Bilateral 1980's  . Breast surgery Right 06/13/14    right breast wide excision  . Colonoscopy  2008    Dr Kristopher Glee  . Appendectomy      Family History  Problem Relation Age of Onset  . Pancreatitis Father   . Cancer Paternal Grandmother     breast    Social History History  Substance Use Topics  . Smoking status: Former Smoker -- 1.00 packs/day for 40 years  . Smokeless tobacco: Never Used  . Alcohol Use: No    Allergies  Allergen Reactions  .  Other Anaphylaxis    "Yellow Dye"  . Codeine Nausea And Vomiting    Current Outpatient Prescriptions  Medication Sig Dispense Refill  . Calcium Carbonate-Vit D-Min (CALTRATE 600+D PLUS PO) Take by mouth.    . EPINEPHrine 0.3 mg/0.3 mL IJ SOAJ injection Inject 0.3 mg into the muscle once.    . hydrochlorothiazide (HYDRODIURIL) 25 MG tablet Take 25 mg by mouth daily.    Marland Kitchen letrozole (FEMARA) 2.5 MG tablet Take 2.5 mg by mouth daily.    Marland Kitchen lovastatin (MEVACOR) 40 MG tablet Take 40 mg by mouth at bedtime.    . metFORMIN (GLUCOPHAGE) 500 MG tablet Take 500 mg by mouth 2 (two) times daily with a meal.    . metoprolol tartrate (LOPRESSOR) 25 MG tablet Take 25 mg by mouth 2 (two) times daily.    . silver sulfADIAZINE (SILVADENE) 1 % cream Apply to affected area daily 50 g 1  . tiotropium (SPIRIVA) 18 MCG inhalation capsule Place 18 mcg into inhaler and inhale daily.    Marland Kitchen venlafaxine XR (EFFEXOR-XR) 150 MG 24 hr capsule Take 150 mg by mouth daily with breakfast.     No current facility-administered medications for this visit.    Review of Systems Review of Systems  Constitutional: Negative.   Respiratory: Negative.   Cardiovascular: Negative.   Gastrointestinal: Positive for nausea and abdominal pain. Negative for vomiting, diarrhea and constipation.    Blood pressure 138/78, pulse 80, resp. rate 16, height 5' 5" (1.651 m), weight 200 lb (90.719 kg).  Physical Exam Physical Exam  Constitutional: She is oriented to person, place, and time. She appears well-developed and well-nourished.  Neck: Neck supple.  Cardiovascular: Normal rate, regular rhythm and normal heart sounds.   Pulmonary/Chest: Effort normal and breath sounds normal. Right breast exhibits no inverted nipple, no mass, no nipple discharge, no skin change and no tenderness. Left breast exhibits no inverted nipple, no mass, no nipple discharge, no skin change and no tenderness.    Thickening along lateral scar right breast and 1  cm area at edge of areolar.  Abdominal: Soft. Normal appearance and bowel sounds are normal. There is no tenderness. No hernia.    Well healed cholecystectomy scar, no mass.  Lymphadenopathy:    She has no cervical adenopathy.    She has no axillary adenopathy.  Neurological: She is alert and oriented to person, place, and time.  Skin: Skin is warm and dry.    Data Reviewed Ultrasound of the right breast in the area of palpable abnormality showed a 0.37 x 0.49 x 0.54 cm smoothly marginated hypoechoic nodule with no clear-cut acoustic shadowing or enhancement was noted. BI-RADS-3.  Assessment    New nodule in the right breast, likely related to surgery and partial breast radiation.  Now chronic right lower quadrant pain without clear etiology.    Plan    Aspiration of the lesion and the breast was offered if the patient was anxious. Declined at this time. We'll reassess at the time of her planned follow-up in April.  In light of the persistent pain we'll arrange for a CT of the abdomen and pelvis. The patient is aware this will not identify adhesions.    Follow up as scheduled for breast. Abd CT scan.  Patient has been scheduled for a CT abdomen/pelvis with contrast at [facility] for 10-04-14 at 10 am (arrive 9:45 am). Prep: no solids 4 hours prior but patient may have clear liquids up until exam time, pick up prep kit, hold metformin day of scan and 48 hours after and take medication list. Patient verbalizes understanding.    PCP:  Sandra Burnett  Burnett, Sandra W 10/02/2014, 8:47 PM    

## 2014-10-02 NOTE — Patient Instructions (Addendum)
Continue self breast exams. Call office for any new breast issues or concerns. Follow up as scheduled April  Patient has been scheduled for a CT abdomen/pelvis with contrast at [facility] for 10-04-14 at 10 am (arrive 9:45 am). Prep: no solids 4 hours prior but patient may have clear liquids up until exam time, pick up prep kit, hold metformin day of scan and 48 hours after and take medication list. Patient verbalizes understanding.

## 2014-10-04 ENCOUNTER — Ambulatory Visit: Payer: Self-pay | Admitting: General Surgery

## 2014-10-07 ENCOUNTER — Encounter: Payer: Self-pay | Admitting: General Surgery

## 2014-10-08 ENCOUNTER — Telehealth: Payer: Self-pay | Admitting: General Surgery

## 2014-10-08 NOTE — Telephone Encounter (Signed)
I notified the patient that I reviewed her CT scan of the abdomen and pelvis dated 10/04/2014. The exam was obtained for her complaints of right lower quadrant pain and discomfort. Her clinical exam had been negative.  Review the CT shows that indeed her appendix was removed at the time of her open cholecystectomy in the distant past. Hysterectomy is noted. Mild prominence of the common bile duct not uncommon in patient status post cholecystectomy is noted. Hepatic steatosis. She has mild diverticulosis involving the sigmoid colon which is far away from the area of her present symptoms. It is observed that the cecum weighs across this area and she may be experiencing some slight pressure from the weight of stool in this area cans the abdominal wall.  The patient reports that she's begun low-dose anti-inflammatories with significant improvement in her discomfort. I don't see any evidence of obvious hip arthritis on the CT scan, but this is not the ideal modality for evaluation.  At this time he seems to be making progress. She'll continue follow-up as previously scheduled in regards to her breast cancer.

## 2014-10-31 ENCOUNTER — Ambulatory Visit
Admit: 2014-10-31 | Disposition: A | Discharge status: Home / Self Care | Payer: Self-pay | Attending: Oncology | Admitting: Oncology

## 2014-11-15 ENCOUNTER — Ambulatory Visit
Admit: 2014-11-15 | Disposition: A | Discharge status: Home / Self Care | Payer: Self-pay | Attending: Oncology | Admitting: Oncology

## 2014-12-05 ENCOUNTER — Encounter: Payer: Self-pay | Admitting: General Surgery

## 2014-12-05 ENCOUNTER — Ambulatory Visit (INDEPENDENT_AMBULATORY_CARE_PROVIDER_SITE_OTHER): Payer: 59 | Admitting: General Surgery

## 2014-12-05 VITALS — BP 152/82 | HR 90 | Resp 14 | Ht 65.0 in | Wt 201.0 lb

## 2014-12-05 DIAGNOSIS — C50411 Malignant neoplasm of upper-outer quadrant of right female breast: Secondary | ICD-10-CM

## 2014-12-05 NOTE — Progress Notes (Signed)
Patient ID: Sandra Burnett, female   DOB: 04-24-1946, 69 y.o.   MRN: [id number]  Chief Complaint  Patient presents with  . Follow-up    HPI Sandra Burnett is a 69 y.o. female.  Here today for her 4 month follow up breast evaluation of a right breast mass. She does not feel it is as large as before.   She plans on retiring in May 2016.    HPI  Past Medical History  Diagnosis Date  . Hypertension   . Diabetes mellitus without complication   . Hyperlipidemia   . Osteopenia   . COPD (chronic obstructive pulmonary disease)   . Osteoporosis   . Asthma   . Psoriasis     Past Surgical History  Procedure Laterality Date  . Carpel tunnel Bilateral 1984  . Abdominal hysterectomy  1970's    partial  . Cholecystectomy  1984  . Tubal ligation    . Breast biopsy Bilateral 1980's  . Breast surgery Right 06/13/14    right breast wide excision  . Colonoscopy  2008    Dr Kristopher Glee  . Appendectomy      Family History  Problem Relation Age of Onset  . Pancreatitis Father   . Cancer Paternal Grandmother     breast    Social History History  Substance Use Topics  . Smoking status: Former Smoker -- 1.00 packs/day for 40 years  . Smokeless tobacco: Never Used  . Alcohol Use: No    Allergies  Allergen Reactions  . Other Anaphylaxis    "Yellow Dye"  . Codeine Nausea And Vomiting    Current Outpatient Prescriptions  Medication Sig Dispense Refill  . Calcium Carbonate-Vit D-Min (CALTRATE 600+D PLUS PO) Take by mouth 2 (two) times daily.     Marland Kitchen EPINEPHrine 0.3 mg/0.3 mL IJ SOAJ injection Inject 0.3 mg into the muscle once.    . hydrochlorothiazide (HYDRODIURIL) 25 MG tablet Take 25 mg by mouth daily.    Marland Kitchen letrozole (FEMARA) 2.5 MG tablet Take 2.5 mg by mouth daily.    Marland Kitchen lovastatin (MEVACOR) 40 MG tablet Take 40 mg by mouth at bedtime.    . metFORMIN (GLUCOPHAGE) 500 MG tablet Take 500 mg by mouth 2 (two) times daily with a meal.    . metoprolol tartrate (LOPRESSOR) 25 MG tablet  Take 25 mg by mouth 2 (two) times daily.    . Multiple Vitamin (MULTIVITAMIN) capsule Take 1 capsule by mouth daily.    . Omega-3 Fatty Acids (FISH OIL) 1000 MG CAPS Take by mouth daily.    . silver sulfADIAZINE (SILVADENE) 1 % cream Apply to affected area daily 50 g 1  . tiotropium (SPIRIVA) 18 MCG inhalation capsule Place 18 mcg into inhaler and inhale daily.    Marland Kitchen venlafaxine XR (EFFEXOR-XR) 150 MG 24 hr capsule Take 150 mg by mouth daily with breakfast.     No current facility-administered medications for this visit.    Review of Systems Review of Systems  Constitutional: Negative.   Respiratory: Negative.   Cardiovascular: Negative.   Neurological: Positive for headaches.    Blood pressure 152/82, pulse 90, resp. rate 14, height 5\' 5"  (1.651 m), weight 201 lb (91.173 kg).  Physical Exam Physical Exam  Constitutional: She is oriented to person, place, and time. She appears well-developed and well-nourished.  Neck: Neck supple.  Cardiovascular: Normal rate, regular rhythm and normal heart sounds.   Pulmonary/Chest: Effort normal and breath sounds normal. Right breast exhibits no inverted nipple, no  mass, no nipple discharge, no skin change and no tenderness. Left breast exhibits no inverted nipple, no mass, no nipple discharge, no skin change and no tenderness.    Well healed incision 9-11 o'clock and thickening at 9 o'clock.  Lymphadenopathy:    She has no cervical adenopathy.  Neurological: She is alert and oriented to person, place, and time.  Skin: Skin is warm and dry.      Assessment    Nodular density of the right breast resolving.    Plan    We will plan for follow-up examination in fall 2016 with bilateral mammograms at that time.    Follow up in September with bilateral diagnostic mammograms.  PCP:  Emerson Monte Dr. Marvetta Gibbons, [place] Gleason 12/05/2014, 7:18 PM

## 2014-12-05 NOTE — Patient Instructions (Addendum)
The patient is aware to call back for any questions or concerns. Follow up in September with bilateral diagnostic mammograms.

## 2014-12-07 NOTE — Op Note (Signed)
PATIENT NAME:  Sandra Burnett, Sandra Burnett MR#:  [id number] DATE OF BIRTH:  [birth year]  DATE OF PROCEDURE:  06/13/2014  PREOPERATIVE DIAGNOSIS: Invasive carcinoma of the right breast.   POSTOPERATIVE DIAGNOSIS: Invasive carcinoma of the right breast.    OPERATIVE PROCEDURE: Wide local excision with ultrasound guidance, sentinel node biopsy.   OPERATING SURGEON: Robert Bellow, MD    ANESTHESIA: General by LMA under Dr. Andree Elk, Marcaine 0.5% with 1:200,000 units of epinephrine, 30 mL local infiltration.   ESTIMATED BLOOD LOSS: Minimal.   CLINICAL NOTE: This 69 year old woman who was recently identified with an abnormal mammogram. Vacuum-assisted biopsy showed evidence of invasive mammary carcinoma. The patient desires breast conservation.   OPERATIVE NOTE: The patient underwent general anesthesia. The breast and axilla were prepped with ChloraPrep and draped. She had previously been injected with technetium sulfur colloid in the radiology department and received 3 mL of normal saline/methylene blue diluted 2:1 prior to prepping.   The Gamma finder was used to evaluate the axilla. An area of increased uptake was identified. A transverse incision was made after instillation of local anesthetic. The skin and subcutaneous tissue was divided sharply and the remaining dissection completed with electrocautery. A hot blue node and a single hot non-blue node were identified and sent for frozen section. Touch preps were reported as negative for macro-metastatic disease by Delorse Lek, MD from pathology.   While awaiting the lymph node report attention was turned to the breast. Ultrasound was used to confirm the site of the previous biopsy. A curvilinear incision was made and from the 8 to 11 o'clock position and carried down through the skin and subcutaneous tissue with hemostasis achieved by electrocautery. A block of tissue measuring 5 x 5 x 4 cm extending down to but not including the underlying pectoralis fascia  was excised, orientated and sent for specimen radiograph. The marking clip was near the medial border of the resection. Gross examination report again was negative for margin involvement. The deep breast tissue was freed from the underlying tissue and approximated with interrupted 2-0 Vicryl sutures. The adipose layer was approximated with interrupted 2-0 Vicryl sutures. The skin was mobilized approximately 3 cm to allow tension-free anastomosis. This was completed using 4-0 Vicryl subcuticular sutures. The axilla was closed in layers with 2-0 Vicryl deep and running 4-0 Vicryl subcuticular suture for the skin. Benzoin and Steri-Strips were applied followed by a Telfa pad, fluff gauze, Kerlix and an Ace wrap.   The patient tolerated the procedure well and was taken to the recovery room in stable condition.    ____________________________ Robert Bellow, MD jwb:AT D: 06/13/2014 13:53:28 ET T: 06/13/2014 22:50:08 ET JOB#: [id number]  cc: Robert Bellow, MD, <Dictator> Adin Hector, MD Martie Lee. Oliva Bustard, MD JEFFREY Amedeo Kinsman MD ELECTRONICALLY SIGNED 06/16/2014 17:42

## 2014-12-07 NOTE — Consult Note (Signed)
Reason for Visit: This 69 year old Female patient presents to the clinic for initial evaluation of  breast cancer .   Referred by Dr. Bary Castilla.  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 This was confirmed on ultrasound and prompted a biopsy positive for invasive mammary carcinoma. She underwent a wide local excision and sentinel node biopsy. Tumor was 0.8 cm ER/PR positive HER-2/neu not overexpressed. Margin was clear 3.5 mm. 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 The tumor was overall grade 1. 2 sentinel lymph nodes were removed and both negative. She is now referred to radiation oncology for consideration of adjuvant treatment. She is doing well still some slight breast tenderness.  Past Hx:    Hyperlipidemia:    Tachycardia:    Anemia:    Breast Cancer:    breast biopsies: 1984 and 1978-fibrocystic breast   psoriasis:    diabetes mellitis type ll:    hypertension:    copd:    Core biopsy:    carpal tunnel syndrome surgical repair:    Hysterectomy - Partial:    Cholecystectomy:   Past, Family and Social History:  Past Medical History positive   Cardiovascular hyperlipidemia; hypertension; tachycardia   Respiratory asthma; COPD   Endocrine diabetes mellitus   Past Surgical History cholecystectomy; partial  hysterectomy, carpal tunnel surgery, tubal ligation   Past Medical History Comments psoriasis, anemia, carpal tunnel syndrome, osteopenia   Family History positive   Family History Comments paternal grandmother with breast cancer father with pancreatitis   Social History positive   Social History Comments 40-pack-year smoking history, no EtOH abuse history   Additional Past Medical and Surgical History accompanied by husband today   Allergies:   Other -Explain in Comment: Anaphylaxis  Codeine: Other  Home Meds:  Home Medications: Medication Instructions Status  acetaminophen-HYDROcodone 325 mg-5 mg oral tablet 1 tab(s) orally every 4 hours, As needed, moderate pain (4-6/10) Active  Effexor 150 mg oral capsule, extended release 1 cap(s) orally once a day (in the morning) Active  lovastatin 40 mg oral tablet 1 tab(s) orally once a day (at bedtime) Active  Spiriva 18 mcg inhalation capsule 1 each inhaled once a day (in the morning) Active  Caltrate 600 + D 600 mg-800 intl units oral tablet 1 tab(s) orally once a day Active  Epi EZ Pen 1 dose(s)  , As Needed Active  metFORMIN 500 mg oral tablet 1 tab(s) orally 2 times a day Active  metoprolol 25 mg oral tablet 1 tab(s) orally 2 times a day Active  hydrochlorothiazide 25 mg oral tablet 1 tab(s) orally once a day Active  Mobic 15 mg oral tablet 1 tab(s) orally once a day Active  Advair Diskus 250 mcg-50 mcg inhalation powder 1 puff(s) inhaled 2 times a day Active   Review of Systems:  General negative   Performance Status (ECOG) 0  Skin negative   Breast see HPI   Ophthalmologic negative   ENMT negative   Respiratory and Thorax negative   Cardiovascular negative   Gastrointestinal negative   Genitourinary negative   Musculoskeletal negative   Neurological negative   Psychiatric negative   Hematology/Lymphatics negative   Endocrine negative   Allergic/Immunologic negative   Review of Systems   denies any  weight loss, fatigue, weakness, fever, chills or night sweats. Patient denies any loss of vision, blurred vision. Patient denies any ringing  of the ears or hearing loss. No irregular heartbeat. Patient denies heart murmur or history of fainting. Patient denies any chest pain or pain radiating to her upper extremities. Patient denies any shortness of breath, difficulty breathing at night, cough or hemoptysis. Patient denies any swelling in the lower legs. Patient denies any nausea vomiting, vomiting of blood, or coffee ground material in the vomitus. Patient denies any stomach pain. Patient states has had normal bowel movements no significant constipation or diarrhea. Patient denies any dysuria, hematuria or significant nocturia. Patient denies any problems walking, swelling in the joints or loss of balance. Patient denies any skin changes, loss of hair or loss of weight. Patient denies any excessive worrying or anxiety or significant depression. Patient denies any problems with insomnia. Patient denies excessive thirst, polyuria, polydipsia. Patient denies any swollen glands, patient denies easy bruising or easy bleeding. Patient denies any recent infections, allergies or URI. Patient "s visual fields have not changed significantly in recent time.   Nursing Notes:  Nursing Vital Signs and Chemo Nursing Nursing Notes: *CC Vital Signs Flowsheet:   09-Nov-15 10:26  Temp Temperature 98.9  Pulse Pulse 83  Respirations Respirations 19  SBP SBP 156  DBP DBP 81  Current Weight (kg) (kg) 90.3   Physical Exam:  General/Skin/HEENT:  General normal   Skin normal   Eyes normal   ENMT normal   Head and Neck normal   Additional PE well-developed slightly obese female in NAD. She status post wide local excision the 9:00 position of the right breast incision is well-healed. No dominant mass or nodularity is noted in either breast in 2 positions examined. Right axilla is still somewhat tender from surgery  no axillary or supraclavicular adenopathy is noted bilaterally. Lungs are clear to A&P cardiac examination shows regular rate and rhythm.   Breasts/Resp/CV/GI/GU:  Respiratory and Thorax normal   Cardiovascular normal   Gastrointestinal normal   Genitourinary normal   MS/Neuro/Psych/Lymph:  Musculoskeletal normal   Neurological normal   Lymphatics normal   Other Results:  Radiology Results: [facility]:    01-Oct-15 13:15, Digital Additional Views Rt Breast (SCR)  Digital Additional Views Rt Breast (SCR)   REASON FOR EXAM:    RT BREAST AV FOR MASS  COMMENTS:       PROCEDURE: MAM - MAM DIG ADDVIEWS RT SCR  - May 16 2014  1:15PM     CLINICAL DATA:  Screening callback for questioned right breast mass    EXAM:  DIGITAL DIAGNOSTIC  right MAMMOGRAM    ULTRASOUND right BREAST    COMPARISON:  [facility]    ACR Breast Density Category a: The breast tissue is almost entirely  fatty.    FINDINGS:  Additional views confirm persistence of a 4 mm mildly irregular mass  in the right upper outer quadrant. A questionedmass in the right  breast 6 o'clock location likely corresponds to an area of  parenchymal nodularity or intramammary lymph node stable since at  least the prior 2011 exam.    On physical exam, I palpate no abnormality in the right upper outer  quadrant.    Ultrasound is performed, showing a hypoechoic irregular spiculated  mass right breast 9 o'clock location 7 cm from the nipple measuring  4 x 3 x 3 mm. This corresponds to the mammographic finding. No  corresponding sonographic abnormality is identified in the right  breast 6 o'clock location. No right axillary lymphadenopathy.     IMPRESSION:  Suspicious right breast mass 9 o'clock location. Ultrasound-guided  core biopsy will be scheduled 06/13/2014 per the patient's specific  request.    RECOMMENDATION:  Right breast ultrasound-guided core biopsy    I have discussed the findings and recommendations  with the patient.  Results were also provided in writing at the conclusion of the  visit. If applicable, a reminder letter will be sent to the patient  regarding the next appointment.  BI-RADS CATEGORY  4: Suspicious.      Electronically Signed    By: Conchita Paris M.D.    On: 05/16/2014 13:38         Verified By: Arline Asp, M.D.,   Relevent Results:   Relevant Scans and Labs mammograms reviewed   Assessment and Plan: Impression:   stage I well-differentiated invasive mammary carcinoma the right breast as was wide local excision and sentinel node biopsy in 69 year old female. Tumor is ER/PR positive HER-2/neu not overexpressed. Plan:   this time I discussed the case personally with surgeon believe this patient would be a good candidate for accelerated partial breast irradiation. She is interested in that procedure and is scheduled for later this week to have MammoSite catheter placed. I would plan on delivering 3400 cGy in 10 fractions at 340 cGy twice a day using iridium 192 high dose rate remote afterloading and BrachyVision treatment planning. Risks and benefits of treatment including some thickening of the lumpectomy site, fatigue, possible small postage stamp skin reaction over the balloon catheter, all were explained in detail to the patient. She will also be a candidate for aromatase inhibitor therapy after completion of radiation.  I would like to take this opportunity for allowing me to participate in the care of your patient..  Fax to Physician:  Physicians To Recieve Fax: Tama High, MD - [phone] Robert Bellow - [phone].  Electronic Signatures: Armstead Peaks (MD)  (Signed [phone] 15:10)  Authored: HPI, Diagnosis, Past Hx, PFSH, Allergies, Home Meds, ROS, Nursing Notes, Physical Exam, Other Results, Relevent Results, Encounter Assessment and Plan, Fax to Physician   Last Updated: 09-Nov-15 15:10 by Armstead Peaks (MD)

## 2014-12-09 LAB — SURGICAL PATHOLOGY

## 2015-01-10 ENCOUNTER — Encounter: Payer: Self-pay | Admitting: Radiation Oncology

## 2015-01-10 ENCOUNTER — Ambulatory Visit: Payer: Medicare Other | Admitting: Radiation Oncology

## 2015-01-10 ENCOUNTER — Ambulatory Visit
Admission: RE | Admit: 2015-01-10 | Discharge: 2015-01-10 | Disposition: A | Discharge status: Home / Self Care | Payer: Medicare Other | Source: Ambulatory Visit | Attending: Radiation Oncology | Admitting: Radiation Oncology

## 2015-01-10 VITALS — BP 131/67 | HR 82 | Resp 22 | Wt 201.7 lb

## 2015-01-10 DIAGNOSIS — D0591 Unspecified type of carcinoma in situ of right breast: Secondary | ICD-10-CM

## 2015-01-10 NOTE — Progress Notes (Signed)
Radiation Oncology Follow up Note  Name: Sandra Burnett   Date:   01/10/2015 MRN:  [id number] DOB: [birth year]    This 69 y.o. female presents to the clinic today for follow-up for breast cancer.  REFERRING PROVIDER: Adin Hector, MD  HPI: Patient is a 69 year old female now out 6 months having completed accelerated partial breast radiation to her right breast 3.8 cm ER/PR positive HER-2/neu not overexpressed invasive mammary carcinoma stage I (T1 BN 0 M0). She is seen today in routine follow-up and is doing well. She specifically denies breast tenderness  or bone pain.. She's had a minor cough for the past several weeks may be allergy related. Follow-up mammograms have already been ordered. She's currently on Femara tolerating that well without side effect.  COMPLICATIONS OF TREATMENT: none  FOLLOW UP COMPLIANCE: keeps appointments   PHYSICAL EXAM:  BP 131/67 mmHg  Pulse 82  Resp 22  Wt 201 lb 11.5 oz (91.5 kg) Lungs are clear to A&P cardiac examination essentially unremarkable with regular rate and rhythm. No dominant mass or nodularity is noted in either breast in 2 positions examined. Incision is well-healed. No axillary or supraclavicular adenopathy is appreciated. Cosmetic result is excellent. Well-developed well-nourished patient in NAD. HEENT reveals PERLA, EOMI, discs not visualized.  Oral cavity is clear. No oral mucosal lesions are identified. Neck is clear without evidence of cervical or supraclavicular adenopathy. Lungs are clear to A&P. Cardiac examination is essentially unremarkable with regular rate and rhythm without murmur rub or thrill. Abdomen is benign with no organomegaly or masses noted. Motor sensory and DTR levels are equal and symmetric in the upper and lower extremities. Cranial nerves II through XII are grossly intact. Proprioception is intact. No peripheral adenopathy or edema is identified. No motor or sensory levels are noted. Crude visual fields are within  normal range.   RADIOLOGY RESULTS: Follow-up mammograms to be ordered by surgeon  PLAN: At the present time she continues to do well with no evidence of disease. I am please were overall progress. I've asked to see her back in 1 year for follow-up. Follow-up mammograms to be ordered by surgery. Patient is to call with any concerns. She continues on aromatase inhibitor without side effect.    Armstead Peaks., MD

## 2015-01-20 ENCOUNTER — Other Ambulatory Visit: Payer: Self-pay

## 2015-01-20 NOTE — Patient Outreach (Signed)
[facility]) Care Management  01/20/2015  Sandra Burnett 1946-03-08 [id number]  High cost referral assessment call.  Member states she has recovered from her radiation treatments well and she has gotten her energy back.  States that she has semi-retired and she is only working 10 hours a week.  States that Medicare is her primary insurance now.  Member has been followed by Gentry Fitz RN through [facility]. No case management needs and member will have her case closed by [facility]. Peter Garter RN, [facility] Care Management Coordinator-[facility] [place] [facility] [phone]

## 2015-01-29 NOTE — Patient Outreach (Signed)
[facility]) Care Management  01/29/2015  Sandra Burnett November 23, 1945 [id number]   Shayma informed me that she no longer carries [place] medical insurance and will no longer participate in the LTW program.    Gentry Fitz, RN, BA, [place], [place]:  [phone]  Fax:  [phone] E-mail: [email or web address] [address] [place], [place]  [address]

## 2015-02-05 ENCOUNTER — Other Ambulatory Visit: Payer: Self-pay | Admitting: *Deleted

## 2015-02-05 DIAGNOSIS — C50911 Malignant neoplasm of unspecified site of right female breast: Secondary | ICD-10-CM

## 2015-02-05 MED ORDER — AZITHROMYCIN 250 MG PO TABS: ORAL_TABLET | ORAL | Status: DC

## 2015-03-12 ENCOUNTER — Other Ambulatory Visit: Payer: Self-pay

## 2015-03-12 DIAGNOSIS — C50411 Malignant neoplasm of upper-outer quadrant of right female breast: Secondary | ICD-10-CM

## 2015-05-02 ENCOUNTER — Other Ambulatory Visit: Payer: Self-pay | Admitting: *Deleted

## 2015-05-02 DIAGNOSIS — C50411 Malignant neoplasm of upper-outer quadrant of right female breast: Secondary | ICD-10-CM

## 2015-05-05 ENCOUNTER — Inpatient Hospital Stay: Payer: Medicare Other | Attending: Oncology

## 2015-05-05 ENCOUNTER — Encounter: Payer: Self-pay | Admitting: Oncology

## 2015-05-05 ENCOUNTER — Inpatient Hospital Stay (HOSPITAL_BASED_OUTPATIENT_CLINIC_OR_DEPARTMENT_OTHER): Payer: Medicare Other | Admitting: Oncology

## 2015-05-05 VITALS — BP 170/84 | HR 67 | Temp 96.4°F | Wt 198.9 lb

## 2015-05-05 DIAGNOSIS — M8580 Other specified disorders of bone density and structure, unspecified site: Secondary | ICD-10-CM | POA: Diagnosis not present

## 2015-05-05 DIAGNOSIS — Z923 Personal history of irradiation: Secondary | ICD-10-CM

## 2015-05-05 DIAGNOSIS — I10 Essential (primary) hypertension: Secondary | ICD-10-CM | POA: Insufficient documentation

## 2015-05-05 DIAGNOSIS — E785 Hyperlipidemia, unspecified: Secondary | ICD-10-CM | POA: Insufficient documentation

## 2015-05-05 DIAGNOSIS — Z79899 Other long term (current) drug therapy: Secondary | ICD-10-CM | POA: Diagnosis not present

## 2015-05-05 DIAGNOSIS — L409 Psoriasis, unspecified: Secondary | ICD-10-CM | POA: Insufficient documentation

## 2015-05-05 DIAGNOSIS — J449 Chronic obstructive pulmonary disease, unspecified: Secondary | ICD-10-CM | POA: Insufficient documentation

## 2015-05-05 DIAGNOSIS — E119 Type 2 diabetes mellitus without complications: Secondary | ICD-10-CM | POA: Insufficient documentation

## 2015-05-05 DIAGNOSIS — Z170 Estrogen receptor positive status [ER+]: Secondary | ICD-10-CM | POA: Insufficient documentation

## 2015-05-05 DIAGNOSIS — C50411 Malignant neoplasm of upper-outer quadrant of right female breast: Secondary | ICD-10-CM

## 2015-05-05 DIAGNOSIS — Z79810 Long term (current) use of selective estrogen receptor modulators (SERMs): Secondary | ICD-10-CM | POA: Insufficient documentation

## 2015-05-05 DIAGNOSIS — Z87891 Personal history of nicotine dependence: Secondary | ICD-10-CM | POA: Diagnosis not present

## 2015-05-05 DIAGNOSIS — M1990 Unspecified osteoarthritis, unspecified site: Secondary | ICD-10-CM | POA: Insufficient documentation

## 2015-05-05 DIAGNOSIS — J45909 Unspecified asthma, uncomplicated: Secondary | ICD-10-CM | POA: Diagnosis not present

## 2015-05-05 DIAGNOSIS — N879 Dysplasia of cervix uteri, unspecified: Secondary | ICD-10-CM | POA: Insufficient documentation

## 2015-05-05 LAB — CBC WITH DIFFERENTIAL/PLATELET
Basophils Absolute: 0.1 10*3/uL (ref 0–0.1)
Basophils Relative: 1 %
Eosinophils Absolute: 0.2 10*3/uL (ref 0–0.7)
Eosinophils Relative: 3 %
HCT: 38.9 % (ref 35.0–47.0)
HEMOGLOBIN: 13.2 g/dL (ref 12.0–16.0)
LYMPHS ABS: 1.6 10*3/uL (ref 1.0–3.6)
Lymphocytes Relative: 27 %
MCH: 32.2 pg (ref 26.0–34.0)
MCHC: 33.9 g/dL (ref 32.0–36.0)
MCV: 94.8 fL (ref 80.0–100.0)
Monocytes Absolute: 0.4 10*3/uL (ref 0.2–0.9)
Monocytes Relative: 7 %
NEUTROS PCT: 62 %
Neutro Abs: 3.7 10*3/uL (ref 1.4–6.5)
Platelets: 220 10*3/uL (ref 150–440)
RBC: 4.1 MIL/uL (ref 3.80–5.20)
RDW: 13.3 % (ref 11.5–14.5)
WBC: 6 10*3/uL (ref 3.6–11.0)

## 2015-05-05 LAB — COMPREHENSIVE METABOLIC PANEL
ALK PHOS: 68 U/L (ref 38–126)
ALT: 26 U/L (ref 14–54)
ANION GAP: 6 (ref 5–15)
AST: 26 U/L (ref 15–41)
Albumin: 4.1 g/dL (ref 3.5–5.0)
BILIRUBIN TOTAL: 0.5 mg/dL (ref 0.3–1.2)
BUN: 21 mg/dL — ABNORMAL HIGH (ref 6–20)
CALCIUM: 8.7 mg/dL — AB (ref 8.9–10.3)
CO2: 28 mmol/L (ref 22–32)
CREATININE: 0.67 mg/dL (ref 0.44–1.00)
Chloride: 103 mmol/L (ref 101–111)
GFR calc Af Amer: >60 mL/min (ref >60)
Glucose, Bld: 118 mg/dL — ABNORMAL HIGH (ref 65–99)
Potassium: 4 mmol/L (ref 3.5–5.1)
Sodium: 137 mmol/L (ref 135–145)
TOTAL PROTEIN: 7.2 g/dL (ref 6.5–8.1)

## 2015-05-05 MED ORDER — EXEMESTANE 25 MG PO TABS: 25.0000 mg | ORAL_TABLET | Freq: Every day | ORAL | Status: DC

## 2015-05-05 NOTE — Progress Notes (Signed)
Patient does not have living will.  Former smoker.  Patient complains of generalized achiness throughout her body.  States her hands and arms hurt.

## 2015-05-11 ENCOUNTER — Encounter: Payer: Self-pay | Admitting: Oncology

## 2015-05-11 NOTE — Progress Notes (Signed)
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 status post brachii therapy in November of 2015 Started on Femara in December of 2015.  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 This was confirmed on ultrasound and prompted a biopsy positive for invasive mammary carcinoma. She underwent a wide local excision and sentinel node biopsy. Tumor was 0.8 cm ER/PR positive HER-2/neu not overexpressed. Margin was clear 3.5 mm. 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 INTERVAL HISTORY:  69 year old lady with a history of carcinoma of breast on tamoxifen.  Getting regular mammogram and physical examination done by Dr. Bary Castilla and Dr. Baruch Gouty.. Taking letrozole or tolerating treatment very well.  No bony pains.  No bony fractures reported.  Patient is getting bone density study done on a regular basis.  As well getting mammogram done  REVIEW OF SYSTEMS:   GENERAL:  Feels good.  Active.  No fevers, sweats or weight loss. PERFORMANCE STATUS (ECOG):  01 HEENT:  No visual changes, runny nose, sore throat, mouth sores or tenderness. Lungs: No shortness of breath or cough.  No hemoptysis. Cardiac:  No chest pain, palpitations, orthopnea, or PND. GI:  No nausea, vomiting, diarrhea, constipation, melena  or hematochezia. GU:  No urgency, frequency, dysuria, or hematuria. Musculoskeletal:  No back pain.  No joint pain.  No muscle tenderness. Extremities:  No pain or swelling. Skin:  No rashes or skin changes. Neuro:  No headache, numbness or weakness, balance or coordination issues. Endocrine:  No diabetes, thyroid issues, hot flashes or night sweats. Psych:  No mood changes, depression or anxiety. Pain:  No focal pain. Review of systems:  All other systems reviewed and found to be negative. As per HPI. Otherwise, a complete review of systems is negatve.  PAST MEDICAL HISTORY: Past Medical History  Diagnosis Date  . Hypertension   . Diabetes mellitus without complication   . Hyperlipidemia   . Osteopenia   . COPD (chronic obstructive pulmonary disease)   . Osteoporosis   . Asthma   . Psoriasis   . Breast cancer     PAST SURGICAL HISTORY: Past Surgical History  Procedure Laterality Date  . Carpel tunnel Bilateral 1984  . Abdominal hysterectomy  1970's    partial  . Cholecystectomy  1984  . Tubal ligation    . Breast biopsy Bilateral 1980's  . Breast surgery Right 06/13/14    right breast wide excision  . Colonoscopy  2008    Dr Kristopher Glee  . Appendectomy      FAMILY HISTORY Family History  Problem Relation Age of Onset  . Pancreatitis Father   . Cancer Paternal Grandmother     breast   Significant History/PMH:   Hyperlipidemia:    Tachycardia:    Anemia:    Breast Cancer:    breast biopsies: 1984  and 1978-fibrocystic breast   psoriasis:    diabetes mellitis type ll:    hypertension:    copd:    Core biopsy:    carpal tunnel syndrome surgical repair:    Hysterectomy - Partial:    Cholecystectomy:   Smoking History: Smoking History h/o 1 pk/day x 45 yrs use.  PFSH: Family History: noncontributory  Social History: negative alcohol, negative tobacco  Additional Past Medical and Surgical History: st, Family and Social History:   Past Medical  History positive   Cardiovascular hyperlipidemia; hypertension; tachycardia   Respiratory asthma; COPD   Endocrine diabetes mellitus   Past Surgical History cholecystectomy; partial hysterectomy, carpal tunnel surgery, tubal ligation   ADVANCED DIRECTIVES:  No flowsheet data found.  HEALTH MAINTENANCE: Social History  Substance Use Topics  . Smoking status: Former Smoker -- 1.00 packs/day for 40 years  . Smokeless tobacco: Never Used  . Alcohol Use: No      Allergies  Allergen Reactions  . Other Anaphylaxis    "Yellow Dye"  . Codeine Nausea And Vomiting and Nausea Only    Other reaction(s): Vomiting    Current Outpatient Prescriptions  Medication Sig Dispense Refill  . Calcium Carbonate-Vit D-Min (CALTRATE 600+D PLUS PO) Take by mouth 2 (two) times daily.     Marland Kitchen EPINEPHrine 0.3 mg/0.3 mL IJ SOAJ injection Inject 0.3 mg into the muscle once.    . Fluticasone-Salmeterol (ADVAIR) 100-50 MCG/DOSE AEPB INHALE ONE PUFF 2 TIMES A DAY AS DIRECTED    . hydrochlorothiazide (HYDRODIURIL) 25 MG tablet Take 25 mg by mouth daily.    Marland Kitchen letrozole (FEMARA) 2.5 MG tablet Take 2.5 mg by mouth daily.    Marland Kitchen lovastatin (MEVACOR) 40 MG tablet Take 40 mg by mouth at bedtime.    . metFORMIN (GLUCOPHAGE) 500 MG tablet Take 500 mg by mouth 2 (two) times daily with a meal.    . metFORMIN (GLUCOPHAGE) 500 MG tablet TAKE ONE TABLET BY MOUTH 2 TIMES A DAY    . metoprolol tartrate (LOPRESSOR) 25 MG tablet Take 25 mg by mouth 2 (two) times daily.    . Multiple Vitamin (MULTIVITAMIN) capsule Take 1 capsule by mouth daily.    . Omega-3 Fatty Acids (FISH OIL) 1000 MG CAPS Take by mouth daily.    . silver sulfADIAZINE (SILVADENE) 1 % cream Apply to affected area daily 50 g 1  . tiotropium (SPIRIVA) 18 MCG inhalation capsule Place 18 mcg into inhaler and inhale daily.    Marland Kitchen venlafaxine XR (EFFEXOR-XR) 150 MG 24 hr capsule Take 150 mg by mouth daily with breakfast.    . azithromycin (ZITHROMAX Z-PAK) 250 MG  tablet Take per package directions (Patient not taking: Reported on 05/05/2015) 6 each 0  . exemestane (AROMASIN) 25 MG tablet Take 1 tablet (25 mg total) by mouth daily after breakfast. 90 tablet 3   No current facility-administered medications for this visit.    OBJECTIVE:  Filed Vitals:   05/05/15 1137  BP: 170/84  Pulse: 67  Temp: 96.4 F (35.8 C)     Body mass index is 33.09 kg/(m^2).    ECOG FS:1 - Symptomatic but completely ambulatory  PHYSICAL EXAM: General  status: Performance status is good.  Patient has not lost significant weight HEENT: No evidence of stomatitis. Sclera and conjunctivae :: No jaundice.   pale looking. Lungs: Air  entry equal on both sides.  No rhonchi.  No rales.  Cardiac: Heart sounds are normal.  No pericardial rub.  No murmur. Lymphatic  system: Cervical, axillary, inguinal, lymph nodes not palpable GI: Abdomen is soft.  No ascites.  Liver spleen not palpable.  No tenderness.  Bowel sounds are within normal limit Lower extremity: No edema Neurological system: Higher functions, cranial nerves intact no evidence of peripheral neuropathy. Skin: No rash.  No ecchymosis.. Examination of both breasts was not done as recently was done by surgeon as well as radiation oncologist  LAB RESULTS:  CBC Latest Ref Rng 05/05/2015 12/24/2011  WBC 3.6 - 11.0 K/uL 6.0 5.1  Hemoglobin 12.0 - 16.0 g/dL 13.2 12.9  Hematocrit 35.0 - 47.0 % 38.9 38.4  Platelets 150 - 440 K/uL 220 205    Appointment on 05/05/2015  Component Date Value Ref Range Status  . WBC 05/05/2015 6.0  3.6 - 11.0 K/uL Final  . RBC 05/05/2015 4.10  3.80 - 5.20 MIL/uL Final  . Hemoglobin 05/05/2015 13.2  12.0 - 16.0 g/dL Final  . HCT 05/05/2015 38.9  35.0 - 47.0 % Final  . MCV 05/05/2015 94.8  80.0 - 100.0 fL Final  . MCH 05/05/2015 32.2  26.0 - 34.0 pg Final  . MCHC 05/05/2015 33.9  32.0 - 36.0 g/dL Final  . RDW 05/05/2015 13.3  11.5 - 14.5 % Final  . Platelets 05/05/2015 220  150 - 440 K/uL Final   . Neutrophils Relative % 05/05/2015 62   Final  . Neutro Abs 05/05/2015 3.7  1.4 - 6.5 K/uL Final  . Lymphocytes Relative 05/05/2015 27   Final  . Lymphs Abs 05/05/2015 1.6  1.0 - 3.6 K/uL Final  . Monocytes Relative 05/05/2015 7   Final  . Monocytes Absolute 05/05/2015 0.4  0.2 - 0.9 K/uL Final  . Eosinophils Relative 05/05/2015 3   Final  . Eosinophils Absolute 05/05/2015 0.2  0 - 0.7 K/uL Final  . Basophils Relative 05/05/2015 1   Final  . Basophils Absolute 05/05/2015 0.1  0 - 0.1 K/uL Final  . Sodium 05/05/2015 137  135 - 145 mmol/L Final  . Potassium 05/05/2015 4.0  3.5 - 5.1 mmol/L Final  . Chloride 05/05/2015 103  101 - 111 mmol/L Final  . CO2 05/05/2015 28  22 - 32 mmol/L Final  . Glucose, Bld 05/05/2015 118* 65 - 99 mg/dL Final  . BUN 05/05/2015 21* 6 - 20 mg/dL Final  . Creatinine, Ser 05/05/2015 0.67  0.44 - 1.00 mg/dL Final  . Calcium 05/05/2015 8.7* 8.9 - 10.3 mg/dL Final  . Total Protein 05/05/2015 7.2  6.5 - 8.1 g/dL Final  . Albumin 05/05/2015 4.1  3.5 - 5.0 g/dL Final  . AST 05/05/2015 26  15 - 41 U/L Final  . ALT 05/05/2015 26  14 - 54 U/L Final  . Alkaline Phosphatase 05/05/2015 68  38 - 126 U/L Final  . Total Bilirubin 05/05/2015 0.5  0.3 - 1.2 mg/dL Final  . GFR calc non Af Amer 05/05/2015 >60  >60 mL/min Final  . GFR calc Af Amer 05/05/2015 >60  >60 mL/min Final   Comment: (NOTE) The eGFR has been calculated using the CKD EPI equation. This calculation has not been validated in all clinical situations. eGFR's persistently <60 mL/min signify possible Chronic Kidney Disease.   . Anion gap 05/05/2015 6  5 - 15 Final         ASSESSMENT: Carcinoma breast (right) stage I disease status post lumpectomy and radiation therapy On Femara On clinical grounds there is no evidence of recurrent disease  MEDICAL DECISION MAKING:  Continue letrozole, calcium and vitamin  D Bone density once a year Mammogram as per surgeon  Patient expressed understanding and  was in agreement with this plan. She also understands that She can call clinic at any time with any questions, concerns, or complaints.    No matching staging information was found for the patient.  Forest Gleason, MD   05/11/2015 6:06 PM

## 2015-05-15 ENCOUNTER — Ambulatory Visit
Admission: RE | Admit: 2015-05-15 | Discharge: 2015-05-15 | Disposition: A | Discharge status: Home / Self Care | Payer: Medicare Other | Source: Ambulatory Visit | Attending: General Surgery | Admitting: General Surgery

## 2015-05-15 ENCOUNTER — Other Ambulatory Visit: Payer: Self-pay | Admitting: General Surgery

## 2015-05-15 ENCOUNTER — Ambulatory Visit
Admission: RE | Admit: 2015-05-15 | Discharge: 2015-05-15 | Disposition: A | Discharge status: Home / Self Care | Payer: Medicare Other | Source: Ambulatory Visit | Attending: Oncology | Admitting: Oncology

## 2015-05-15 DIAGNOSIS — C50411 Malignant neoplasm of upper-outer quadrant of right female breast: Secondary | ICD-10-CM

## 2015-05-15 DIAGNOSIS — M8580 Other specified disorders of bone density and structure, unspecified site: Secondary | ICD-10-CM | POA: Diagnosis present

## 2015-05-15 DIAGNOSIS — Z853 Personal history of malignant neoplasm of breast: Secondary | ICD-10-CM | POA: Diagnosis not present

## 2015-05-22 ENCOUNTER — Encounter: Payer: Self-pay | Admitting: General Surgery

## 2015-05-22 ENCOUNTER — Ambulatory Visit (INDEPENDENT_AMBULATORY_CARE_PROVIDER_SITE_OTHER): Payer: Medicare Other | Admitting: General Surgery

## 2015-05-22 VITALS — BP 148/62 | HR 78 | Resp 16 | Ht 65.0 in | Wt 197.0 lb

## 2015-05-22 DIAGNOSIS — C50411 Malignant neoplasm of upper-outer quadrant of right female breast: Secondary | ICD-10-CM

## 2015-05-22 NOTE — Progress Notes (Signed)
Patient ID: Sandra Burnett, female   DOB: 12-06-1945, 69 y.o.   MRN: [id number]  Chief Complaint  Patient presents with  . Follow-up    mammogram    HPI Sandra Burnett is a 69 y.o. female who presents for a breast evaluation. The most recent mammogram was done on 05/15/15 . She had a bone density on 05-15-15. Patient does perform regular self breast checks and gets regular mammograms done.  She does states that she has hot flashes more with the Femara. No new breast issues. When she completes the supply of Femara she has she will be switching to Aromasin.  HPI  Past Medical History  Diagnosis Date  . Hypertension   . Diabetes mellitus without complication ([place])   . Hyperlipidemia   . Osteopenia   . COPD (chronic obstructive pulmonary disease) ([place])   . Osteoporosis   . Asthma   . Psoriasis   . Breast cancer ([place]) right    2015- mammosite     Past Surgical History  Procedure Laterality Date  . Carpel tunnel Bilateral 1984  . Abdominal hysterectomy  1970's    partial  . Cholecystectomy  1984  . Tubal ligation    . Breast biopsy Bilateral 1980's  . Breast surgery Right 06/13/14    right breast wide excision  . Colonoscopy  2008    Dr Kristopher Glee  . Appendectomy      Family History  Problem Relation Age of Onset  . Pancreatitis Father   . Cancer Paternal Grandmother     breast    Social History Social History  Substance Use Topics  . Smoking status: Former Smoker -- 1.00 packs/day for 40 years  . Smokeless tobacco: Never Used  . Alcohol Use: No    Allergies  Allergen Reactions  . Other Anaphylaxis    "Yellow Dye"  . Codeine Nausea And Vomiting and Nausea Only    Other reaction(s): Vomiting    Current Outpatient Prescriptions  Medication Sig Dispense Refill  . Calcium Carbonate-Vit D-Min (CALTRATE 600+D PLUS PO) Take by mouth 2 (two) times daily.     Marland Kitchen EPINEPHrine 0.3 mg/0.3 mL IJ SOAJ injection Inject 0.3 mg into the muscle once.    Marland Kitchen exemestane (AROMASIN) 25  MG tablet Take 1 tablet (25 mg total) by mouth daily after breakfast. 90 tablet 3  . Fluticasone-Salmeterol (ADVAIR) 100-50 MCG/DOSE AEPB INHALE ONE PUFF 2 TIMES A DAY AS DIRECTED    . hydrochlorothiazide (HYDRODIURIL) 25 MG tablet Take 25 mg by mouth daily.    Marland Kitchen letrozole (FEMARA) 2.5 MG tablet Take 2.5 mg by mouth daily.    Marland Kitchen lovastatin (MEVACOR) 40 MG tablet Take 40 mg by mouth at bedtime.    . metFORMIN (GLUCOPHAGE) 500 MG tablet TAKE ONE TABLET BY MOUTH 2 TIMES A DAY    . metoprolol tartrate (LOPRESSOR) 25 MG tablet Take 25 mg by mouth 2 (two) times daily.    . Multiple Vitamin (MULTIVITAMIN) capsule Take 1 capsule by mouth daily.    . Omega-3 Fatty Acids (FISH OIL) 1000 MG CAPS Take by mouth daily.    Marland Kitchen tiotropium (SPIRIVA) 18 MCG inhalation capsule Place 18 mcg into inhaler and inhale daily.    . traMADol (ULTRAM) 50 MG tablet Take 50 mg by mouth every 6 (six) hours as needed.    . venlafaxine XR (EFFEXOR-XR) 150 MG 24 hr capsule Take 150 mg by mouth daily with breakfast.     No current facility-administered medications for this visit.  Review of Systems Review of Systems  Constitutional: Negative.   Respiratory: Positive for shortness of breath.   Cardiovascular: Negative.     Blood pressure 148/62, pulse 78, resp. rate 16, height 5\' 5"  (1.651 m), weight 197 lb (89.359 kg).  Physical Exam Physical Exam  Constitutional: She is oriented to person, place, and time. She appears well-developed and well-nourished.  HENT:  Mouth/Throat: Oropharynx is clear and moist.  Eyes: Conjunctivae are normal. No scleral icterus.  Neck: Neck supple.  Cardiovascular: Normal rate, regular rhythm and normal heart sounds.   Pulmonary/Chest: Effort normal and breath sounds normal. Right breast exhibits no inverted nipple, no mass, no nipple discharge, no skin change and no tenderness. Left breast exhibits no inverted nipple, no mass, no nipple discharge, no skin change and no tenderness.   Thickening at base of scar 9 o'clock right breast.  Lymphadenopathy:    She has no cervical adenopathy.    She has no axillary adenopathy.  Neurological: She is alert and oriented to person, place, and time.  Skin: Skin is warm and dry.  psoriasis right lower leg.  Psychiatric: Her behavior is normal.    Data Reviewed Bilateral mammograms dated May 15, 2015 mammograms reviewed. Postsurgical changes. BI-RADS-2   Assessment    Doing well status post breast conservation for malignancy.    Plan        Patient will be asked to return to the office in one year with a bilateral diagnostic mammogram.   PCP:  Threasa Alpha 05/23/2015, 8:26 PM

## 2015-05-22 NOTE — Patient Instructions (Signed)
Patient will be asked to return to the office in one year with a bilateral diagnotic  mammogram. 

## 2015-07-31 ENCOUNTER — Telehealth: Payer: Self-pay | Admitting: *Deleted

## 2015-07-31 MED ORDER — LETROZOLE 2.5 MG PO TABS: 2.5000 mg | ORAL_TABLET | Freq: Every day | ORAL | Status: DC

## 2015-07-31 NOTE — Telephone Encounter (Signed)
Called patient to inform her that Letrozole has been escribed to [facility].  Patient verbalized understanding.

## 2015-07-31 NOTE — Telephone Encounter (Signed)
Letrozole has been escribed to [facility].

## 2015-07-31 NOTE — Telephone Encounter (Signed)
Patient wants to stay on Femara.  Asking that a new prescription be called to [facility].

## 2015-10-17 DIAGNOSIS — F325 Major depressive disorder, single episode, in full remission: Secondary | ICD-10-CM | POA: Insufficient documentation

## 2015-10-17 DIAGNOSIS — M159 Polyosteoarthritis, unspecified: Secondary | ICD-10-CM | POA: Insufficient documentation

## 2015-11-10 DIAGNOSIS — M654 Radial styloid tenosynovitis [de Quervain]: Secondary | ICD-10-CM | POA: Insufficient documentation

## 2016-01-09 ENCOUNTER — Ambulatory Visit
Admission: RE | Admit: 2016-01-09 | Discharge: 2016-01-09 | Disposition: A | Discharge status: Home / Self Care | Payer: Medicare Other | Source: Ambulatory Visit | Attending: Radiation Oncology | Admitting: Radiation Oncology

## 2016-01-09 ENCOUNTER — Encounter: Payer: Self-pay | Admitting: Radiation Oncology

## 2016-01-09 VITALS — BP 168/87 | HR 68 | Temp 98.6°F | Wt 201.6 lb

## 2016-01-09 DIAGNOSIS — Z79811 Long term (current) use of aromatase inhibitors: Secondary | ICD-10-CM | POA: Insufficient documentation

## 2016-01-09 DIAGNOSIS — C50911 Malignant neoplasm of unspecified site of right female breast: Secondary | ICD-10-CM | POA: Diagnosis present

## 2016-01-09 DIAGNOSIS — Z923 Personal history of irradiation: Secondary | ICD-10-CM | POA: Diagnosis not present

## 2016-01-09 DIAGNOSIS — Z170 Estrogen receptor positive status [ER+]: Secondary | ICD-10-CM | POA: Insufficient documentation

## 2016-01-09 DIAGNOSIS — D0591 Unspecified type of carcinoma in situ of right breast: Secondary | ICD-10-CM

## 2016-01-09 NOTE — Progress Notes (Signed)
Radiation Oncology Follow up Note  Name: Sandra Burnett   Date:   01/09/2016 MRN:  [id number] DOB: April 02, 1946    This 70 y.o. female presents to the clinic today for follow-up for invasive mammary carcinoma the right breast one half years out from accelerated partial breast radiation.  REFERRING PROVIDER: Adin Hector, MD  HPI: Patient is a 70 year old female now out a year and a half having completed accelerated partial breast radiation to her right breast for a T1 be N0 ER/PR positive HER-2/neu negative invasive mammary carcinoma. Seen today in routine follow-up she is doing well. She's currently on Aromasin tolerating that well without side effect. Follow-up mammograms have been benign BI-RADS 2. She specifically denies breast tenderness cough or bone pain.  COMPLICATIONS OF TREATMENT: none  FOLLOW UP COMPLIANCE: keeps appointments   PHYSICAL EXAM:  BP 168/87 mmHg  Pulse 68  Temp(Src) 98.6 F (37 C)  Wt 201 lb 9.8 oz (91.45 kg) Lungs are clear to A&P cardiac examination essentially unremarkable with regular rate and rhythm. No dominant mass or nodularity is noted in either breast in 2 positions examined. Incision is well-healed. No axillary or supraclavicular adenopathy is appreciated. Cosmetic result is excellent. Well-developed well-nourished patient in NAD. HEENT reveals PERLA, EOMI, discs not visualized.  Oral cavity is clear. No oral mucosal lesions are identified. Neck is clear without evidence of cervical or supraclavicular adenopathy. Lungs are clear to A&P. Cardiac examination is essentially unremarkable with regular rate and rhythm without murmur rub or thrill. Abdomen is benign with no organomegaly or masses noted. Motor sensory and DTR levels are equal and symmetric in the upper and lower extremities. Cranial nerves II through XII are grossly intact. Proprioception is intact. No peripheral adenopathy or edema is identified. No motor or sensory levels are noted. Crude visual  fields are within normal range.  RADIOLOGY RESULTS: Mammograms are reviewed compatible above-stated findings  PLAN: Present time she continues to do well with no evidence of disease. I've asked to see her back in 1 year for follow-up. Patient knows to call sooner with any concerns. She continues on Aromasin without side effect. Follow-up mammograms ordered been ordered.  I would like to take this opportunity to thank you for allowing me to participate in the care of your patient.Armstead Peaks., MD

## 2016-04-14 ENCOUNTER — Other Ambulatory Visit: Payer: Self-pay | Admitting: General Surgery

## 2016-04-14 DIAGNOSIS — C50411 Malignant neoplasm of upper-outer quadrant of right female breast: Secondary | ICD-10-CM

## 2016-04-21 ENCOUNTER — Encounter: Payer: Self-pay | Admitting: *Deleted

## 2016-04-28 IMAGING — MG MM DIGITAL SCREENING BILAT W/ CAD
3 series · 7 of 7 positions shown · non-contrast
Comparison: Previous exam(s)

CLINICAL DATA: Screening.

EXAM:
DIGITAL SCREENING BILATERAL MAMMOGRAM WITH CAD

[R CC · right · 5 of 5 slices shown (1 of 2)]
[im 1/5]
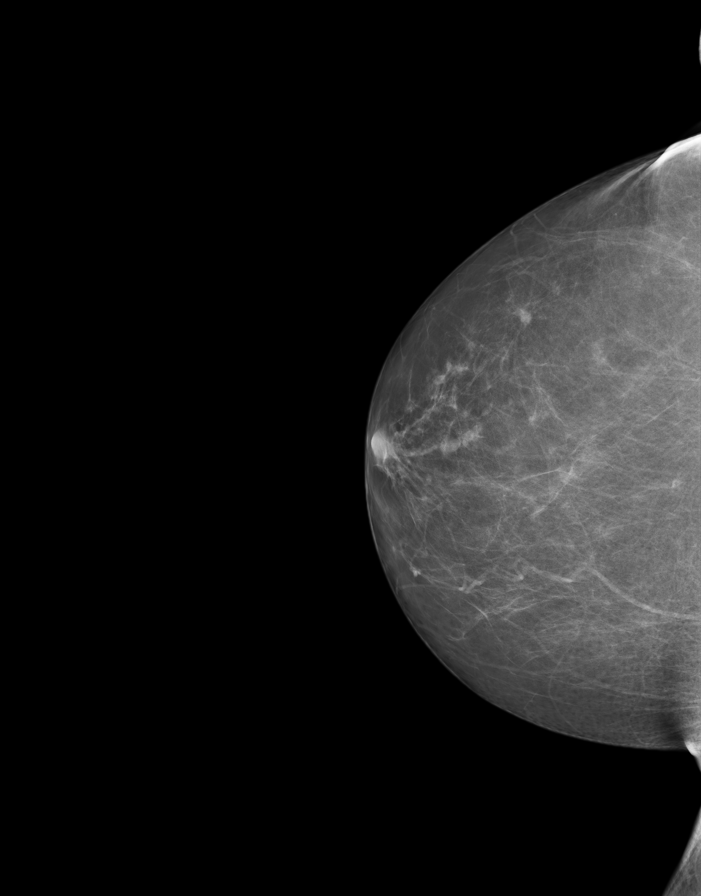
[im 2/5]
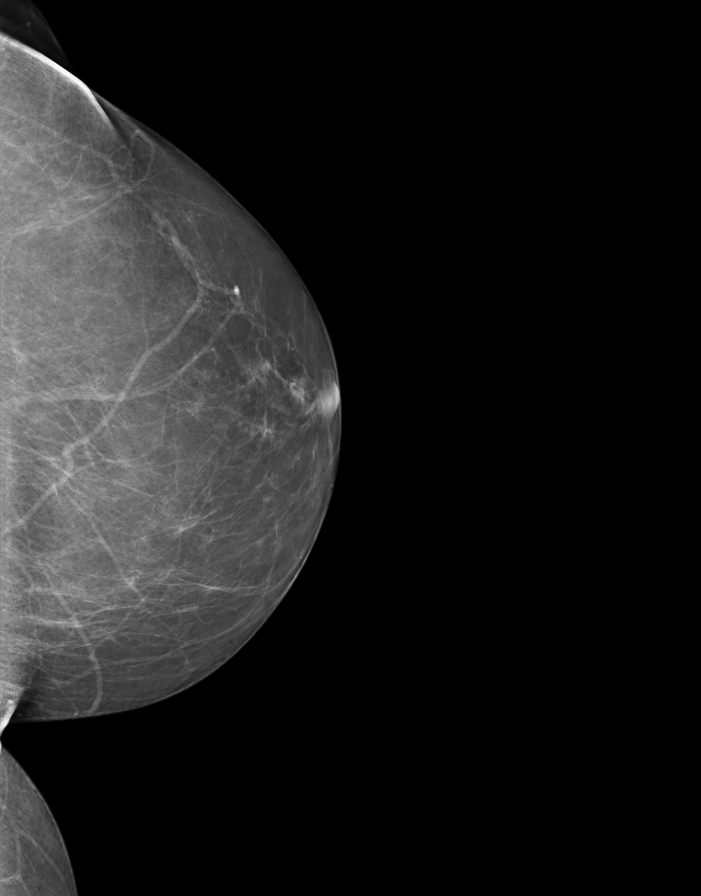
[im 3/5]
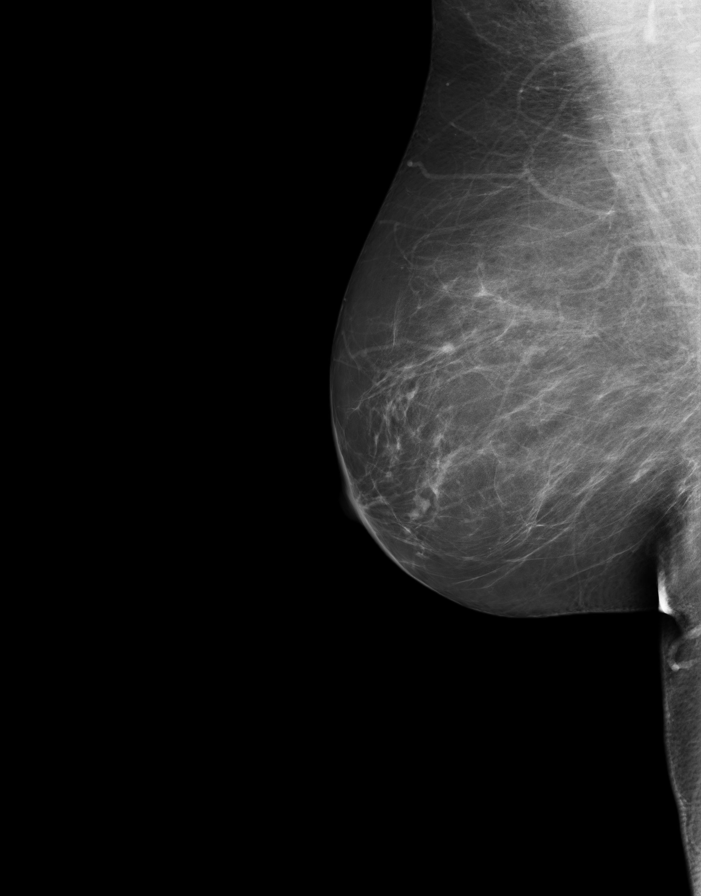
[im 4/5]
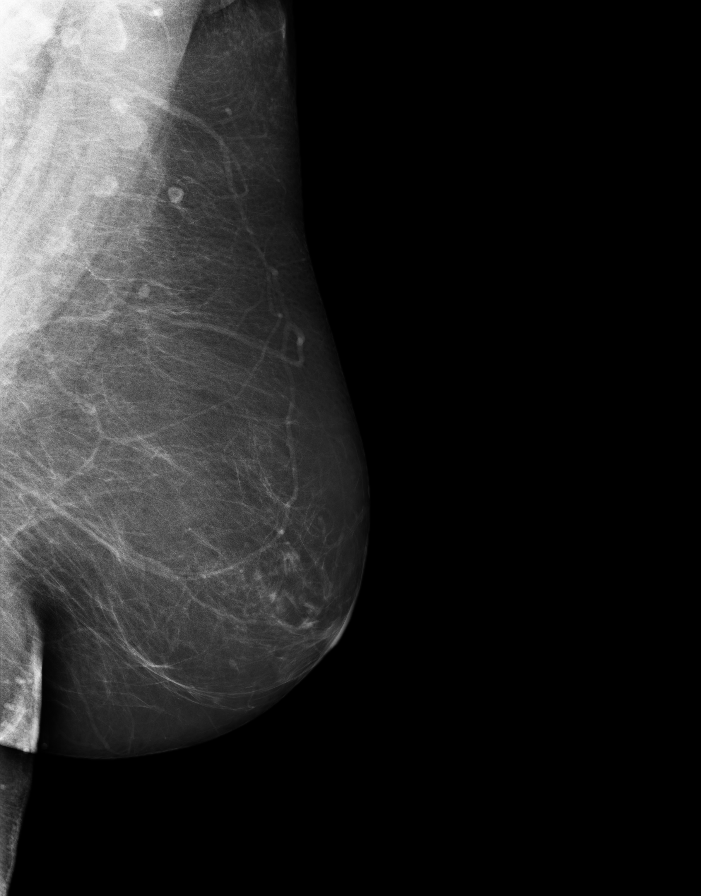
[im 5/5]
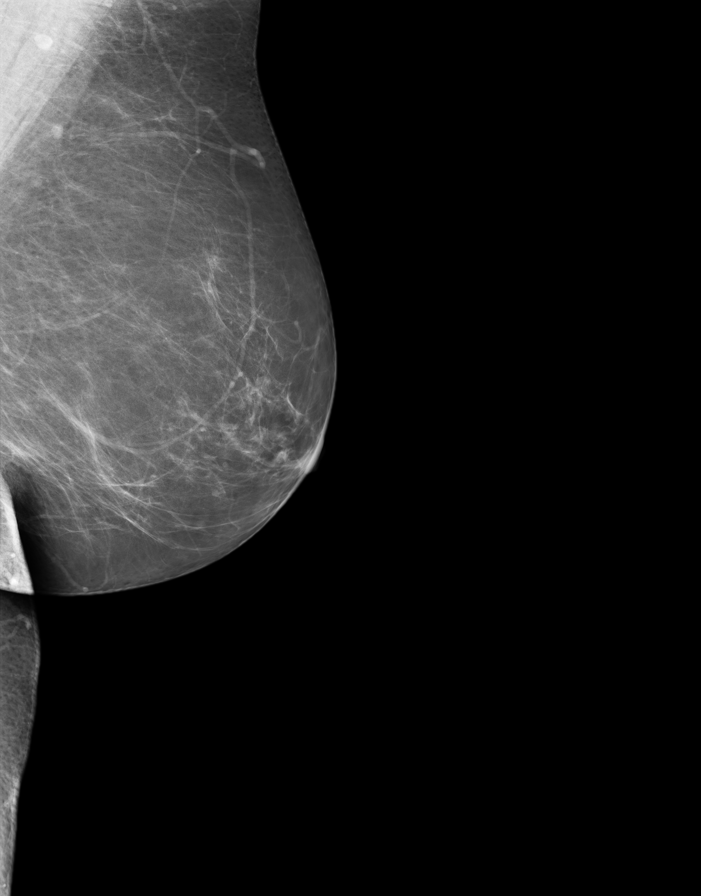

[R MLO]
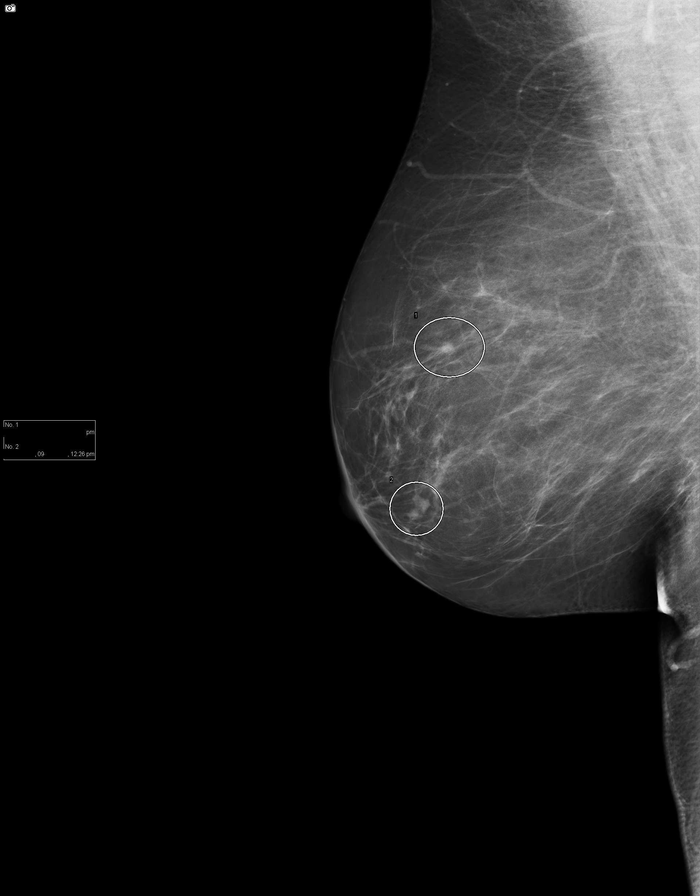

[R CC (2 of 2)]
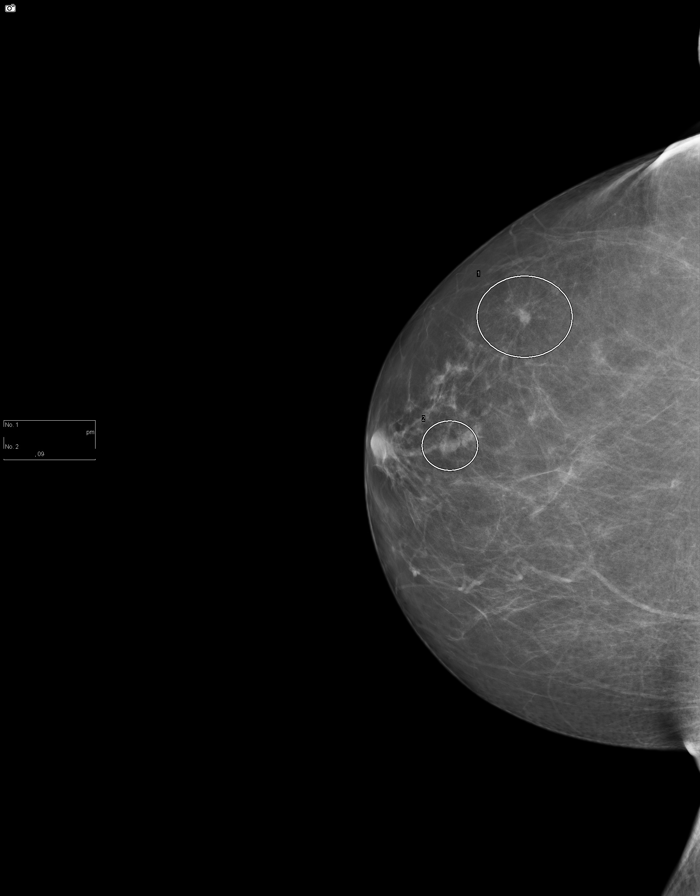

[7 of 7 positions shown; findings below may reference images not displayed]

ACR Breast Density Category b: There are scattered areas of
fibroglandular density.
FINDINGS: In the right breast, possible masses warrant further evaluation with
spot compression views and possibly ultrasound. In the left breast,
no findings suspicious for malignancy. Images were processed with
CAD.
IMPRESSION: Further evaluation is suggested for possible masses in the right
breast.

RECOMMENDATION:
Diagnostic mammogram and possibly ultrasound of the right breast.
(Code:I8-E-55I)

The patient will be contacted regarding the findings, and additional
imaging will be scheduled.

BI-RADS CATEGORY  0: Incomplete. Need additional imaging evaluation
and/or prior mammograms for comparison.

## 2016-04-30 ENCOUNTER — Other Ambulatory Visit: Payer: Self-pay

## 2016-04-30 DIAGNOSIS — C50411 Malignant neoplasm of upper-outer quadrant of right female breast: Secondary | ICD-10-CM

## 2016-05-03 ENCOUNTER — Inpatient Hospital Stay: Payer: Medicare Other | Attending: Internal Medicine | Admitting: Internal Medicine

## 2016-05-03 ENCOUNTER — Inpatient Hospital Stay: Payer: Medicare Other

## 2016-05-03 ENCOUNTER — Other Ambulatory Visit: Payer: No Typology Code available for payment source

## 2016-05-03 ENCOUNTER — Other Ambulatory Visit: Payer: Self-pay

## 2016-05-03 ENCOUNTER — Ambulatory Visit: Payer: No Typology Code available for payment source | Admitting: Oncology

## 2016-05-03 DIAGNOSIS — C50411 Malignant neoplasm of upper-outer quadrant of right female breast: Secondary | ICD-10-CM | POA: Diagnosis present

## 2016-05-03 DIAGNOSIS — E785 Hyperlipidemia, unspecified: Secondary | ICD-10-CM | POA: Diagnosis not present

## 2016-05-03 DIAGNOSIS — I10 Essential (primary) hypertension: Secondary | ICD-10-CM | POA: Diagnosis not present

## 2016-05-03 DIAGNOSIS — J449 Chronic obstructive pulmonary disease, unspecified: Secondary | ICD-10-CM | POA: Diagnosis not present

## 2016-05-03 DIAGNOSIS — Z87891 Personal history of nicotine dependence: Secondary | ICD-10-CM | POA: Insufficient documentation

## 2016-05-03 DIAGNOSIS — Z79811 Long term (current) use of aromatase inhibitors: Secondary | ICD-10-CM | POA: Diagnosis not present

## 2016-05-03 DIAGNOSIS — E119 Type 2 diabetes mellitus without complications: Secondary | ICD-10-CM | POA: Insufficient documentation

## 2016-05-03 DIAGNOSIS — Z170 Estrogen receptor positive status [ER+]: Secondary | ICD-10-CM | POA: Insufficient documentation

## 2016-05-03 DIAGNOSIS — L409 Psoriasis, unspecified: Secondary | ICD-10-CM | POA: Insufficient documentation

## 2016-05-03 DIAGNOSIS — M1990 Unspecified osteoarthritis, unspecified site: Secondary | ICD-10-CM | POA: Diagnosis not present

## 2016-05-03 DIAGNOSIS — Z7984 Long term (current) use of oral hypoglycemic drugs: Secondary | ICD-10-CM | POA: Insufficient documentation

## 2016-05-03 LAB — CBC WITH DIFFERENTIAL/PLATELET
BAND NEUTROPHILS: 3 %
BASOS ABS: 0 10*3/uL (ref 0–0.1)
BLASTS: 0 %
Basophils Relative: 0 %
EOS ABS: 0.2 10*3/uL (ref 0–0.7)
Eosinophils Relative: 2 %
HCT: 39.8 % (ref 35.0–47.0)
HEMOGLOBIN: 13.7 g/dL (ref 12.0–16.0)
Lymphocytes Relative: 19 %
Lymphs Abs: 1.6 10*3/uL (ref 1.0–3.6)
MCH: 32.9 pg (ref 26.0–34.0)
MCHC: 34.3 g/dL (ref 32.0–36.0)
MCV: 96 fL (ref 80.0–100.0)
METAMYELOCYTES PCT: 0 %
Monocytes Absolute: 0.4 10*3/uL (ref 0.2–0.9)
Monocytes Relative: 5 %
Myelocytes: 0 %
Neutro Abs: 6.4 10*3/uL (ref 1.4–6.5)
Neutrophils Relative %: 71 %
Other: 0 %
PROMYELOCYTES ABS: 0 %
Platelets: 244 10*3/uL (ref 150–440)
RBC: 4.15 MIL/uL (ref 3.80–5.20)
RDW: 13.6 % (ref 11.5–14.5)
WBC: 8.6 10*3/uL (ref 3.6–11.0)
nRBC: 0 /100 WBC

## 2016-05-03 LAB — COMPREHENSIVE METABOLIC PANEL
ALBUMIN: 3.9 g/dL (ref 3.5–5.0)
ALK PHOS: 83 U/L (ref 38–126)
ALT: 26 U/L (ref 14–54)
ANION GAP: 9 (ref 5–15)
AST: 27 U/L (ref 15–41)
BUN: 19 mg/dL (ref 6–20)
CALCIUM: 8.9 mg/dL (ref 8.9–10.3)
CO2: 29 mmol/L (ref 22–32)
Chloride: 101 mmol/L (ref 101–111)
Creatinine, Ser: 0.69 mg/dL (ref 0.44–1.00)
GFR calc Af Amer: >60 mL/min (ref >60)
GFR calc non Af Amer: >60 mL/min (ref >60)
Glucose, Bld: 164 mg/dL — ABNORMAL HIGH (ref 65–99)
Potassium: 3.7 mmol/L (ref 3.5–5.1)
Sodium: 139 mmol/L (ref 135–145)
Total Bilirubin: 0.4 mg/dL (ref 0.3–1.2)
Total Protein: 7.4 g/dL (ref 6.5–8.1)

## 2016-05-03 NOTE — Assessment & Plan Note (Addendum)
#  Stage I ER/PR positive HER-2/neu negative breast cancer status post lumpectomy followed by radiation; currently on adjuvant Femara. Clinically no evidence of recurrence. Last mammogram September 2016 no recurrence  # Osteopenia bone density-September 2016 Vitamin D+ ca q day. [hypercalcemia Laquida 2017- 10.7]  # bronchitis- improving.   # arthritis- sec to AI- recommend glucosamine/ chondroitin sulfate; if not improved will give break from femara for 4 weeks; and then try arimidex. For now pt wants to continue femara.   # Follow-up in 6 months with labs.

## 2016-05-03 NOTE — Progress Notes (Signed)
[place] OFFICE PROGRESS NOTE  Patient Care Team: Adin Hector, MD as PCP - General (Internal Medicine) Adin Hector, MD (Internal Medicine) Robert Bellow, MD (General Surgery)  No matching staging information was found for the patient.   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  # SEP 2016- BMD- OSTEOPENIA; mammo-NEG      Breast cancer of upper-outer quadrant of right female breast ([place])   05/28/2014 Initial Diagnosis    Breast cancer of upper-outer quadrant of right female breast [facility])       This is my first interaction with the patient as patient's primary oncologist has been Dr.Choksi. I reviewed the patient's prior charts/pertinent labs/imaging in detail; findings are summarized above.    INTERVAL HISTORY:  Sandra Burnett 70 y.o.  female pleasant patient above history of Stage I breast cancer visit for follow-up.   Patient complains of moderate arthritic pain in her bilateral hands. Noticed it worse on Femara. Otherwise no nausea no vomiting. Appetite good. No lumps or bumps.  About 2 weeks ago she noted to have high-grade fever and coughing;  shortness of breath. She was shortness of breath or resolved. Mild cough; overall improving.  REVIEW OF SYSTEMS:  A complete 10 point review of system is done which is negative except mentioned above/history of present illness.   PAST MEDICAL HISTORY :  Past Medical History:  Diagnosis Date  . Asthma   . Breast cancer ([place]) right   2015- mammosite   . COPD (chronic obstructive pulmonary disease) ([place])   . Diabetes mellitus without complication ([place])   . Hyperlipidemia   . Hypertension   . Osteopenia   . Osteoporosis   . Psoriasis     PAST SURGICAL HISTORY :   Past Surgical History:  Procedure  Laterality Date  . ABDOMINAL HYSTERECTOMY  1970's   partial  . APPENDECTOMY    . BREAST BIOPSY Bilateral 1980's  . BREAST SURGERY Right 06/13/14   right breast wide excision  . carpel tunnel Bilateral 1984  . CHOLECYSTECTOMY  1984  . COLONOSCOPY  2008   Dr Kristopher Glee  . TUBAL LIGATION      FAMILY HISTORY :   Family History  Problem Relation Age of Onset  . Pancreatitis Father   . Cancer Paternal Grandmother     breast    SOCIAL HISTORY:   Social History  Substance Use Topics  . Smoking status: Former Smoker    Packs/day: 1.00    Years: 40.00  . Smokeless tobacco: Never Used  . Alcohol use No    ALLERGIES:  is allergic to lisinopril; other; and codeine.  MEDICATIONS:  Current Outpatient Prescriptions  Medication Sig Dispense Refill  . albuterol (VENTOLIN HFA) 108 (90 Base) MCG/ACT inhaler     . Calcium Carbonate-Vit D-Min (CALTRATE 600+D PLUS PO) Take by mouth 2 (two) times daily.     Marland Kitchen EPINEPHrine 0.3 mg/0.3 mL IJ SOAJ injection Inject 0.3 mg into the muscle once.    Marland Kitchen exemestane (AROMASIN) 25 MG tablet Take 1 tablet (25 mg total) by mouth daily after breakfast. 90 tablet 3  . Fluticasone-Salmeterol (ADVAIR) 100-50 MCG/DOSE AEPB INHALE ONE PUFF 2 TIMES A DAY AS DIRECTED    . hydrochlorothiazide (HYDRODIURIL) 25 MG tablet Take 25 mg by mouth daily.    Marland Kitchen  letrozole (FEMARA) 2.5 MG tablet Take 1 tablet (2.5 mg total) by mouth daily. 90 tablet 3  . lovastatin (MEVACOR) 40 MG tablet Take 40 mg by mouth at bedtime.    . meloxicam (MOBIC) 15 MG tablet     . metFORMIN (GLUCOPHAGE) 500 MG tablet TAKE ONE TABLET BY MOUTH 2 TIMES A DAY    . metoprolol tartrate (LOPRESSOR) 25 MG tablet Take 25 mg by mouth 2 (two) times daily.    . Omega-3 Fatty Acids (FISH OIL) 1000 MG CAPS Take by mouth daily.    Marland Kitchen tiotropium (SPIRIVA) 18 MCG inhalation capsule Place 18 mcg into inhaler and inhale daily.    . traMADol (ULTRAM) 50 MG tablet Take 50 mg by mouth every 6 (six) hours as needed.    .  venlafaxine XR (EFFEXOR-XR) 150 MG 24 hr capsule Take 150 mg by mouth daily with breakfast.    . Multiple Vitamin (MULTIVITAMIN) capsule Take 1 capsule by mouth daily.     No current facility-administered medications for this visit.     PHYSICAL EXAMINATION: ECOG PERFORMANCE STATUS: 0 - Asymptomatic  BP 129/83 (BP Location: Right Arm, Patient Position: Sitting)   Pulse 88   Temp 98.5 F (36.9 C) (Tympanic)   Wt 197 lb (89.4 kg)   SpO2 93%   BMI 32.78 kg/m   Filed Weights   05/03/16 1126  Weight: 197 lb (89.4 kg)    GENERAL: Well-nourished well-developed; Alert, no distress and comfortable.   Alone.  EYES: no pallor or icterus OROPHARYNX: no thrush or ulceration; good dentition  NECK: supple, no masses felt LYMPH:  no palpable lymphadenopathy in the cervical, axillary or inguinal regions LUNGS: clear to auscultation and  No wheeze or crackles HEART/CVS: regular rate & rhythm and no murmurs; No lower extremity edema ABDOMEN:abdomen soft, non-tender and normal bowel sounds Musculoskeletal:no cyanosis of digits and no clubbing  PSYCH: alert & oriented x 3 with fluent speech NEURO: no focal motor/sensory deficits SKIN:  no rashes or significant lesions  Right and left BREAST exam [in the presence of nurse]- no unusual skin changes or dominant masses felt. Surgical scars noted.    LABORATORY DATA:  I have reviewed the data as listed    Component Value Date/Time   NA 139 05/03/2016 1036   NA 139 06/06/2014 1022   K 3.7 05/03/2016 1036   K 3.3 (L) 06/06/2014 1022   CL 101 05/03/2016 1036   CL 103 06/06/2014 1022   CO2 29 05/03/2016 1036   CO2 30 06/06/2014 1022   GLUCOSE 164 (H) 05/03/2016 1036   GLUCOSE 184 (H) 06/06/2014 1022   BUN 19 05/03/2016 1036   BUN 20 (H) 06/06/2014 1022   CREATININE 0.69 05/03/2016 1036   CREATININE 0.86 06/06/2014 1022   CALCIUM 8.9 05/03/2016 1036   CALCIUM 9.1 06/06/2014 1022   PROT 7.4 05/03/2016 1036   PROT 7.3 12/24/2011 0847    ALBUMIN 3.9 05/03/2016 1036   ALBUMIN 3.7 12/24/2011 0847   AST 27 05/03/2016 1036   AST 27 12/24/2011 0847   ALT 26 05/03/2016 1036   ALT 30 12/24/2011 0847   ALKPHOS 83 05/03/2016 1036   ALKPHOS 72 12/24/2011 0847   BILITOT 0.4 05/03/2016 1036   BILITOT 0.3 12/24/2011 0847   GFRNONAA >60 05/03/2016 1036   GFRNONAA >60 06/06/2014 1022   GFRNONAA >60 12/24/2011 0847   GFRAA >60 05/03/2016 1036   GFRAA >60 06/06/2014 1022   GFRAA >60 12/24/2011 0847    No results  found for: SPEP, UPEP  Lab Results  Component Value Date   WBC 8.6 05/03/2016   NEUTROABS 6.4 05/03/2016   HGB 13.7 05/03/2016   HCT 39.8 05/03/2016   MCV 96.0 05/03/2016   PLT 244 05/03/2016      Chemistry      Component Value Date/Time   NA 139 05/03/2016 1036   NA 139 06/06/2014 1022   K 3.7 05/03/2016 1036   K 3.3 (L) 06/06/2014 1022   CL 101 05/03/2016 1036   CL 103 06/06/2014 1022   CO2 29 05/03/2016 1036   CO2 30 06/06/2014 1022   BUN 19 05/03/2016 1036   BUN 20 (H) 06/06/2014 1022   CREATININE 0.69 05/03/2016 1036   CREATININE 0.86 06/06/2014 1022      Component Value Date/Time   CALCIUM 8.9 05/03/2016 1036   CALCIUM 9.1 06/06/2014 1022   ALKPHOS 83 05/03/2016 1036   ALKPHOS 72 12/24/2011 0847   AST 27 05/03/2016 1036   AST 27 12/24/2011 0847   ALT 26 05/03/2016 1036   ALT 30 12/24/2011 0847   BILITOT 0.4 05/03/2016 1036   BILITOT 0.3 12/24/2011 0847       RADIOGRAPHIC STUDIES: I have personally reviewed the radiological images as listed and agreed with the findings in the report. No results found.   ASSESSMENT & PLAN:  Breast cancer of upper-outer quadrant of right female breast ([place]) # Stage I ER/PR positive HER-2/neu negative breast cancer status post lumpectomy followed by radiation; currently on adjuvant Femara. Clinically no evidence of recurrence. Last mammogram September 2016 no recurrence  # Osteopenia bone density-September 2016 Vitamin D+ ca q day. [hypercalcemia Mamie  2017- 10.7]  # bronchitis- improving.   # arthritis- sec to AI- recommend glucosamine/ chondroitin sulfate; if not improved will give break from femara for 4 weeks; and then try arimidex. For now pt wants to continue femara.   # Follow-up in 6 months with labs.   Orders Placed This Encounter  Procedures  . CBC with Differential    Standing Status:   Future    Standing Expiration Date:   05/03/2017  . Comprehensive metabolic panel    Standing Status:   Future    Standing Expiration Date:   05/03/2017   All questions were answered. The patient knows to call the clinic with any problems, questions or concerns.      Cammie Sickle, MD 05/03/2016 1:21 PM

## 2016-05-03 NOTE — Progress Notes (Signed)
Patient ambulates without assistance, brought to exam room 15, accompanied by her husband.  Patient denies pain or discomfort at this time.  BP 129/83 O2 93% on room air

## 2016-05-05 DIAGNOSIS — E538 Deficiency of other specified B group vitamins: Secondary | ICD-10-CM | POA: Insufficient documentation

## 2016-05-18 ENCOUNTER — Other Ambulatory Visit: Payer: No Typology Code available for payment source

## 2016-05-18 ENCOUNTER — Ambulatory Visit: Payer: No Typology Code available for payment source

## 2016-05-21 ENCOUNTER — Ambulatory Visit
Admission: RE | Admit: 2016-05-21 | Discharge: 2016-05-21 | Disposition: A | Discharge status: Home / Self Care | Payer: Medicare Other | Source: Ambulatory Visit | Attending: General Surgery | Admitting: General Surgery

## 2016-05-21 ENCOUNTER — Other Ambulatory Visit: Payer: Self-pay | Admitting: General Surgery

## 2016-05-21 DIAGNOSIS — C50411 Malignant neoplasm of upper-outer quadrant of right female breast: Secondary | ICD-10-CM

## 2016-05-25 ENCOUNTER — Ambulatory Visit (INDEPENDENT_AMBULATORY_CARE_PROVIDER_SITE_OTHER): Payer: Medicare Other | Admitting: General Surgery

## 2016-05-25 ENCOUNTER — Encounter: Payer: Self-pay | Admitting: General Surgery

## 2016-05-25 VITALS — BP 134/72 | HR 74 | Resp 14 | Ht 66.0 in | Wt 198.0 lb

## 2016-05-25 DIAGNOSIS — C50411 Malignant neoplasm of upper-outer quadrant of right female breast: Secondary | ICD-10-CM

## 2016-05-25 NOTE — Patient Instructions (Signed)
Patient to stop her Femara for a month and call back and left [place] know how things are going. The patient has been asked to return to the office in one year with a bilateral diagnostic mammogram.

## 2016-05-25 NOTE — Progress Notes (Signed)
Patient ID: Sandra Burnett, female   DOB: November 18, 1945, 70 y.o.   MRN: [id number]  Chief Complaint  Patient presents with  . Follow-up    mammogram    HPI Sandra Burnett is a 70 y.o. female who presents for a breast evaluation. The most recent mammogram was done on 05/18/16 .  Patient states she has got worse in the last year with soreness on her legs and arms, her hot flashes as got worse.   The patient reports that the soreness in her legs hips and ankles has progressed longer she's been on Femara.  She was originally treated with Femara, changed to Aromasin and then back to Femara. The patient did add glucosamine and chondroitin sulfate after her September visit with medical oncology. Now 3 weeks since she has not appreciated any improvement.     Patient does perform regular self breast checks and gets regular mammograms done.    HPI  Past Medical History:  Diagnosis Date  . Asthma   . Breast cancer ([place]) right   2015- mammosite   . COPD (chronic obstructive pulmonary disease) ([place])   . Diabetes mellitus without complication ([place])   . Hyperlipidemia   . Hypertension   . Osteopenia   . Osteoporosis   . Psoriasis     Past Surgical History:  Procedure Laterality Date  . ABDOMINAL HYSTERECTOMY  1970's   partial  . APPENDECTOMY    . BREAST BIOPSY Bilateral 1980's  . BREAST BIOPSY Right 2015   positive  . BREAST SURGERY Right 06/13/14   right breast wide excision  . carpel tunnel Bilateral 1984  . CHOLECYSTECTOMY  1984  . COLONOSCOPY  2008   Dr Kristopher Glee  . TUBAL LIGATION      Family History  Problem Relation Age of Onset  . Pancreatitis Father   . Cancer Paternal Grandmother     breast  . Breast cancer Paternal Grandmother   . Breast cancer Paternal Aunt     Social History Social History  Substance Use Topics  . Smoking status: Former Smoker    Packs/day: 1.00    Years: 40.00  . Smokeless tobacco: Never Used  . Alcohol use No    Allergies  Allergen  Reactions  . Lisinopril Other (See Comments)    Severe hypotension  . Other Anaphylaxis and Other (See Comments)    Yellow dye Elevated LFTs "Yellow Dye"  . Codeine Nausea And Vomiting and Nausea Only    Other reaction(s): Vomiting    Current Outpatient Prescriptions  Medication Sig Dispense Refill  . albuterol (VENTOLIN HFA) 108 (90 Base) MCG/ACT inhaler     . Calcium Carbonate-Vit D-Min (CALTRATE 600+D PLUS PO) Take by mouth daily.     Marland Kitchen EPINEPHrine 0.3 mg/0.3 mL IJ SOAJ injection Inject 0.3 mg into the muscle once.    Marland Kitchen exemestane (AROMASIN) 25 MG tablet Take 1 tablet (25 mg total) by mouth daily after breakfast. 90 tablet 3  . Fluticasone-Salmeterol (ADVAIR) 100-50 MCG/DOSE AEPB INHALE ONE PUFF 2 TIMES A DAY AS DIRECTED    . hydrochlorothiazide (HYDRODIURIL) 25 MG tablet Take 25 mg by mouth daily.    Marland Kitchen letrozole (FEMARA) 2.5 MG tablet Take 1 tablet (2.5 mg total) by mouth daily. 90 tablet 3  . lovastatin (MEVACOR) 40 MG tablet Take 40 mg by mouth at bedtime.    . meloxicam (MOBIC) 15 MG tablet     . metFORMIN (GLUCOPHAGE) 500 MG tablet TAKE ONE TABLET BY MOUTH 2 TIMES A DAY    .  metoprolol tartrate (LOPRESSOR) 25 MG tablet Take 25 mg by mouth 2 (two) times daily.    . Multiple Vitamin (MULTIVITAMIN) capsule Take 1 capsule by mouth daily.    . Omega-3 Fatty Acids (FISH OIL) 1000 MG CAPS Take by mouth daily.    Marland Kitchen tiotropium (SPIRIVA) 18 MCG inhalation capsule Place 18 mcg into inhaler and inhale daily.    . traMADol (ULTRAM) 50 MG tablet Take 50 mg by mouth every 6 (six) hours as needed.    . venlafaxine XR (EFFEXOR-XR) 150 MG 24 hr capsule Take 150 mg by mouth daily with breakfast.     No current facility-administered medications for this visit.     Review of Systems Review of Systems  Constitutional: Negative.   Respiratory: Negative.   Cardiovascular: Negative.     Blood pressure 134/72, pulse 74, resp. rate 14, height 5\' 6"  (1.676 m), weight 198 lb (89.8 kg).  Physical  Exam Physical Exam  Constitutional: She is oriented to person, place, and time. She appears well-developed and well-nourished.  Eyes: Conjunctivae are normal. No scleral icterus.  Neck: Neck supple.  Cardiovascular: Normal rate, regular rhythm and normal heart sounds.   Pulmonary/Chest: Effort normal and breath sounds normal. Right breast exhibits no inverted nipple, no mass, no nipple discharge, no skin change and no tenderness. Left breast exhibits no inverted nipple, no mass, no nipple discharge, no skin change and no tenderness.    Abdominal: Soft. Bowel sounds are normal. There is no tenderness.  Lymphadenopathy:    She has no cervical adenopathy.    She has no axillary adenopathy.  Neurological: She is alert and oriented to person, place, and time.  Skin: Skin is dry.    Data Reviewed Medical oncology note of 05/03/2016.  Bilateral mammograms dated 05/21/2016 reviewed. Scarring at site of previous accelerated breast radiation. No other interval change. BI-RADS-2.    Assessment    No evidence of recurrent breast cancer.  Significant arthralgias on aromatase inhibitor.    Plan     Dr. Rogue Bussing in his notes reported a plan for a 1 month holiday from Femara if she failed to respond to other measures. She's been encouraged to do this under reported a follow-up with her arthralgia complaints at that time. If she significantly improved, she may be a candidate for tamoxifen in place of an aromatase inhibitor. Risk of tamoxifen, DVT/pulmonary embolus is different than aromatase inhibitors. She is status post hysterectomy and has no risk of endometrial cancer. This will not likely change her vasomotor symptoms.    Patient to stop her Femara for a month and call back and left [place] know how things are going. The patient has been asked to return to the office in one year with a bilateral diagnostic mammogram. This information has been scribed by Gaspar Cola CMA.   Robert Bellow 05/25/2016, 1:21 PM

## 2016-06-14 DIAGNOSIS — I700 Atherosclerosis of aorta: Secondary | ICD-10-CM | POA: Insufficient documentation

## 2016-07-21 ENCOUNTER — Ambulatory Visit: Payer: Medicare Other | Admitting: Radiation Oncology

## 2016-08-18 ENCOUNTER — Encounter: Payer: Self-pay | Admitting: Radiation Oncology

## 2016-08-18 ENCOUNTER — Ambulatory Visit
Admission: RE | Admit: 2016-08-18 | Discharge: 2016-08-18 | Disposition: A | Discharge status: Home / Self Care | Payer: Medicare HMO | Source: Ambulatory Visit | Attending: Radiation Oncology | Admitting: Radiation Oncology

## 2016-08-18 VITALS — BP 154/91 | HR 77 | Temp 96.6°F | Resp 18 | Wt 201.2 lb

## 2016-08-18 DIAGNOSIS — C50911 Malignant neoplasm of unspecified site of right female breast: Secondary | ICD-10-CM | POA: Diagnosis not present

## 2016-08-18 DIAGNOSIS — Z79811 Long term (current) use of aromatase inhibitors: Secondary | ICD-10-CM | POA: Insufficient documentation

## 2016-08-18 DIAGNOSIS — C50811 Malignant neoplasm of overlapping sites of right female breast: Secondary | ICD-10-CM | POA: Diagnosis not present

## 2016-08-18 DIAGNOSIS — C50411 Malignant neoplasm of upper-outer quadrant of right female breast: Secondary | ICD-10-CM

## 2016-08-18 DIAGNOSIS — Z923 Personal history of irradiation: Secondary | ICD-10-CM | POA: Diagnosis not present

## 2016-08-18 DIAGNOSIS — Z170 Estrogen receptor positive status [ER+]: Secondary | ICD-10-CM | POA: Insufficient documentation

## 2016-08-18 NOTE — Progress Notes (Signed)
Radiation Oncology Follow up Note  Name: Sandra Burnett   Date:   08/18/2016 MRN:  [id number] DOB: 17-Mar-1946    This 71 y.o. female presents to the clinic today for a 2 year follow-up status post accelerated partial breast irradiation for stage I ER/PR positive invasive mammary carcinoma of the right breast.  REFERRING PROVIDER: Adin Hector, MD  HPI: Patient is a 71 year old female now out 2 years having completed accelerated partial breast radiation to her right breast for stage I ER/PR positive invasive mammary carcinoma. She is seen today in routine follow-up and is doing well. She specifically denies breast tenderness cough or bone pain. She is currently on. Femara tolerating that well without side effect. Her follow-up mammograms have been BI-RADS 2 benign.   COMPLICATIONS OF TREATMENT: none  FOLLOW UP COMPLIANCE: keeps appointments   PHYSICAL EXAM:  BP (!) 154/91   Pulse 77   Temp (!) 96.6 F (35.9 C)   Resp 18   Wt 201 lb 2.7 oz (91.3 kg)   BMI 32.47 kg/m  Lungs are clear to A&P cardiac examination essentially unremarkable with regular rate and rhythm. No dominant mass or nodularity is noted in either breast in 2 positions examined. Incision is well-healed. No axillary or supraclavicular adenopathy is appreciated. Cosmetic result is excellent. Well-developed well-nourished patient in NAD. HEENT reveals PERLA, EOMI, discs not visualized.  Oral cavity is clear. No oral mucosal lesions are identified. Neck is clear without evidence of cervical or supraclavicular adenopathy. Lungs are clear to A&P. Cardiac examination is essentially unremarkable with regular rate and rhythm without murmur rub or thrill. Abdomen is benign with no organomegaly or masses noted. Motor sensory and DTR levels are equal and symmetric in the upper and lower extremities. Cranial nerves II through XII are grossly intact. Proprioception is intact. No peripheral adenopathy or edema is identified. No motor or  sensory levels are noted. Crude visual fields are within normal range.  RADIOLOGY RESULTS: Mammograms reviewed and compatible with the above-stated findings  PLAN: Present time patient is doing well with no evidence of disease. I'm please were overall progress. She continues on Femara without side effect. I have asked to see her back in 1 year for follow-up.  I would like to take this opportunity to thank you for allowing me to participate in the care of your patient.Armstead Peaks., MD

## 2016-09-03 ENCOUNTER — Other Ambulatory Visit: Payer: Self-pay | Admitting: *Deleted

## 2016-09-03 MED ORDER — LETROZOLE 2.5 MG PO TABS: 2.5000 mg | ORAL_TABLET | Freq: Every day | ORAL | 3 refills | Status: DC

## 2016-09-06 ENCOUNTER — Telehealth: Payer: Self-pay | Admitting: *Deleted

## 2016-09-06 MED ORDER — LETROZOLE 2.5 MG PO TABS: 2.5000 mg | ORAL_TABLET | Freq: Every day | ORAL | 3 refills | Status: DC

## 2016-09-06 NOTE — Telephone Encounter (Signed)
Re filled [facility]

## 2016-09-16 ENCOUNTER — Other Ambulatory Visit: Payer: Self-pay | Admitting: *Deleted

## 2016-09-16 MED ORDER — LETROZOLE 2.5 MG PO TABS: 2.5000 mg | ORAL_TABLET | Freq: Every day | ORAL | 3 refills | Status: DC

## 2016-10-01 ENCOUNTER — Other Ambulatory Visit: Payer: Self-pay | Admitting: *Deleted

## 2016-10-01 MED ORDER — AMOXICILLIN 500 MG PO TABS: 500.0000 mg | ORAL_TABLET | Freq: Two times a day (BID) | ORAL | 1 refills | Status: DC

## 2016-11-01 ENCOUNTER — Ambulatory Visit: Payer: No Typology Code available for payment source | Admitting: Internal Medicine

## 2016-11-01 ENCOUNTER — Other Ambulatory Visit: Payer: No Typology Code available for payment source

## 2016-11-18 ENCOUNTER — Inpatient Hospital Stay: Payer: Medicare HMO

## 2016-11-18 ENCOUNTER — Inpatient Hospital Stay: Payer: Medicare HMO | Admitting: Internal Medicine

## 2016-11-18 NOTE — Progress Notes (Deleted)
[facility] OFFICE PROGRESS NOTE  Patient Care Team: Lynnea Ferrier, MD as PCP - General (Internal Medicine) Lynnea Ferrier, MD (Internal Medicine) Earline Mayotte, MD (General Surgery)  Cancer Staging No matching staging information was found for the patient.   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  # SEP 2016- BMD- OSTEOPENIA; mammo-NEG      Breast cancer of upper-outer quadrant of right female breast (HCC)   05/28/2014 Initial Diagnosis    Breast cancer of upper-outer quadrant of right female breast  [facility])       This is my first interaction with the patient as patient's primary oncologist has been Dr.Choksi. I reviewed the patient's prior charts/pertinent labs/imaging in detail; findings are summarized above.    INTERVAL HISTORY:  Sandra Burnett 71 y.o.  female pleasant patient above history of Stage I breast cancer visit for follow-up.   Patient complains of moderate arthritic pain in her bilateral hands. Noticed it worse on Femara. Otherwise no nausea no vomiting. Appetite good. No lumps or bumps.  About 2 weeks ago she noted to have high-grade fever and coughing;  shortness of breath. She was shortness of breath or resolved. Mild cough; overall improving.  REVIEW OF SYSTEMS:  A complete 10 point review of system is done which is negative except mentioned above/history of present illness.   PAST MEDICAL HISTORY :  Past Medical History:  Diagnosis Date  . Asthma   . 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 COPD (chronic obstructive pulmonary disease) (HCC)   . Diabetes mellitus without complication (HCC)   . Hyperlipidemia   . Hypertension   . Osteopenia   . Osteoporosis   . Psoriasis     PAST SURGICAL HISTORY :   Past Surgical History:   Procedure Laterality Date  . ABDOMINAL HYSTERECTOMY  1970's   partial  . APPENDECTOMY    . BREAST BIOPSY Bilateral 1980's  . BREAST BIOPSY Right 2015   positive  . BREAST SURGERY Right 06/13/14   right breast wide excision  . carpel tunnel Bilateral 1984  . CHOLECYSTECTOMY  1984  . COLONOSCOPY  2008   Dr Nils Flack  . TUBAL LIGATION      FAMILY HISTORY :   Family History  Problem Relation Age of Onset  . Pancreatitis Father   . Cancer Paternal Grandmother     breast  . Breast cancer Paternal Grandmother   . Breast cancer Paternal Aunt     SOCIAL HISTORY:   Social History  Substance Use Topics  . Smoking status: Former Smoker    Packs/day: 1.00    Years: 40.00  . Smokeless tobacco: Never Used  . Alcohol use No    ALLERGIES:  is allergic to lisinopril; other; and codeine.  MEDICATIONS:  Current Outpatient Prescriptions  Medication Sig Dispense Refill  . albuterol (VENTOLIN HFA) 108 (90 Base) MCG/ACT inhaler     . amoxicillin (AMOXIL) 500 MG tablet Take 1 tablet (500 mg total) by mouth 2 (two) times daily. 14 tablet 1  . Calcium Carbonate-Vit D-Min (CALTRATE 600+D PLUS PO) Take by mouth daily.     Marland Kitchen EPINEPHrine 0.3 mg/0.3 mL IJ SOAJ injection Inject 0.3 mg into the muscle once.    Marland Kitchen exemestane (AROMASIN) 25 MG tablet Take 1 tablet (25  mg total) by mouth daily after breakfast. 90 tablet 3  . Fluticasone-Salmeterol (ADVAIR) 100-50 MCG/DOSE AEPB INHALE ONE PUFF 2 TIMES A DAY AS DIRECTED    . hydrochlorothiazide (HYDRODIURIL) 25 MG tablet Take 25 mg by mouth daily.    Marland Kitchen letrozole (FEMARA) 2.5 MG tablet Take 1 tablet (2.5 mg total) by mouth daily. 90 tablet 3  . lovastatin (MEVACOR) 40 MG tablet Take 40 mg by mouth at bedtime.    . meloxicam (MOBIC) 15 MG tablet     . metFORMIN (GLUCOPHAGE) 500 MG tablet TAKE ONE TABLET BY MOUTH 2 TIMES A DAY    . metoprolol tartrate (LOPRESSOR) 25 MG tablet Take 25 mg by mouth 2 (two) times daily.    . Multiple Vitamin (MULTIVITAMIN)  capsule Take 1 capsule by mouth daily.    . Omega-3 Fatty Acids (FISH OIL) 1000 MG CAPS Take by mouth daily.    Marland Kitchen tiotropium (SPIRIVA) 18 MCG inhalation capsule Place 18 mcg into inhaler and inhale daily.    . traMADol (ULTRAM) 50 MG tablet Take 50 mg by mouth every 6 (six) hours as needed.    . venlafaxine XR (EFFEXOR-XR) 150 MG 24 hr capsule Take 150 mg by mouth daily with breakfast.     No current facility-administered medications for this visit.     PHYSICAL EXAMINATION: ECOG PERFORMANCE STATUS: 0 - Asymptomatic  There were no vitals taken for this visit.  There were no vitals filed for this visit.  GENERAL: Well-nourished well-developed; Alert, no distress and comfortable.   Alone.  EYES: no pallor or icterus OROPHARYNX: no thrush or ulceration; good dentition  NECK: supple, no masses felt LYMPH:  no palpable lymphadenopathy in the cervical, axillary or inguinal regions LUNGS: clear to auscultation and  No wheeze or crackles HEART/CVS: regular rate & rhythm and no murmurs; No lower extremity edema ABDOMEN:abdomen soft, non-tender and normal bowel sounds Musculoskeletal:no cyanosis of digits and no clubbing  PSYCH: alert & oriented x 3 with fluent speech NEURO: no focal motor/sensory deficits SKIN:  no rashes or significant lesions  Right and left BREAST exam [in the presence of nurse]- no unusual skin changes or dominant masses felt. Surgical scars noted.    LABORATORY DATA:  I have reviewed the data as listed    Component Value Date/Time   NA 139 05/03/2016 1036   NA 139 06/06/2014 1022   K 3.7 05/03/2016 1036   K 3.3 (L) 06/06/2014 1022   CL 101 05/03/2016 1036   CL 103 06/06/2014 1022   CO2 29 05/03/2016 1036   CO2 30 06/06/2014 1022   GLUCOSE 164 (H) 05/03/2016 1036   GLUCOSE 184 (H) 06/06/2014 1022   BUN 19 05/03/2016 1036   BUN 20 (H) 06/06/2014 1022   CREATININE 0.69 05/03/2016 1036   CREATININE 0.86 06/06/2014 1022   CALCIUM 8.9 05/03/2016 1036    CALCIUM 9.1 06/06/2014 1022   PROT 7.4 05/03/2016 1036   PROT 7.3 12/24/2011 0847   ALBUMIN 3.9 05/03/2016 1036   ALBUMIN 3.7 12/24/2011 0847   AST 27 05/03/2016 1036   AST 27 12/24/2011 0847   ALT 26 05/03/2016 1036   ALT 30 12/24/2011 0847   ALKPHOS 83 05/03/2016 1036   ALKPHOS 72 12/24/2011 0847   BILITOT 0.4 05/03/2016 1036   BILITOT 0.3 12/24/2011 0847   GFRNONAA >60 05/03/2016 1036   GFRNONAA >60 06/06/2014 1022   GFRNONAA >60 12/24/2011 0847   GFRAA >60 05/03/2016 1036   GFRAA >60 06/06/2014 1022  GFRAA >60 12/24/2011 0847    No results found for: SPEP, UPEP  Lab Results  Component Value Date   WBC 8.6 05/03/2016   NEUTROABS 6.4 05/03/2016   HGB 13.7 05/03/2016   HCT 39.8 05/03/2016   MCV 96.0 05/03/2016   PLT 244 05/03/2016      Chemistry      Component Value Date/Time   NA 139 05/03/2016 1036   NA 139 06/06/2014 1022   K 3.7 05/03/2016 1036   K 3.3 (L) 06/06/2014 1022   CL 101 05/03/2016 1036   CL 103 06/06/2014 1022   CO2 29 05/03/2016 1036   CO2 30 06/06/2014 1022   BUN 19 05/03/2016 1036   BUN 20 (H) 06/06/2014 1022   CREATININE 0.69 05/03/2016 1036   CREATININE 0.86 06/06/2014 1022      Component Value Date/Time   CALCIUM 8.9 05/03/2016 1036   CALCIUM 9.1 06/06/2014 1022   ALKPHOS 83 05/03/2016 1036   ALKPHOS 72 12/24/2011 0847   AST 27 05/03/2016 1036   AST 27 12/24/2011 0847   ALT 26 05/03/2016 1036   ALT 30 12/24/2011 0847   BILITOT 0.4 05/03/2016 1036   BILITOT 0.3 12/24/2011 0847       RADIOGRAPHIC STUDIES: I have personally reviewed the radiological images as listed and agreed with the findings in the report. No results found.   ASSESSMENT & PLAN:  No problem-specific Assessment & Plan notes found for this encounter.   No orders of the defined types were placed in this encounter.  All questions were answered. The patient knows to call the clinic with any problems, questions or concerns.      Cammie Sickle,  MD 11/18/2016 8:35 AM

## 2016-11-18 NOTE — Assessment & Plan Note (Deleted)
#  Stage I ER/PR positive HER-2/neu negative breast cancer status post lumpectomy followed by radiation; currently on adjuvant Femara. Clinically no evidence of recurrence. Last mammogram September 2016 no recurrence  # Osteopenia bone density-September 2016 Vitamin D+ ca q day. 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   # arthritis- sec to AI- recommend glucosamine/ chondroitin sulfate; if not improved will give break from femara for 4 weeks; and then try arimidex. For now pt wants to continue femara.   # Follow-up in 6 months with labs. 

## 2016-12-24 DIAGNOSIS — J209 Acute bronchitis, unspecified: Secondary | ICD-10-CM | POA: Diagnosis not present

## 2017-02-14 DIAGNOSIS — I700 Atherosclerosis of aorta: Secondary | ICD-10-CM | POA: Diagnosis not present

## 2017-02-14 DIAGNOSIS — E784 Other hyperlipidemia: Secondary | ICD-10-CM | POA: Diagnosis not present

## 2017-02-14 DIAGNOSIS — F325 Major depressive disorder, single episode, in full remission: Secondary | ICD-10-CM | POA: Diagnosis not present

## 2017-02-14 DIAGNOSIS — J449 Chronic obstructive pulmonary disease, unspecified: Secondary | ICD-10-CM | POA: Diagnosis not present

## 2017-02-14 DIAGNOSIS — E538 Deficiency of other specified B group vitamins: Secondary | ICD-10-CM | POA: Diagnosis not present

## 2017-02-14 DIAGNOSIS — I10 Essential (primary) hypertension: Secondary | ICD-10-CM | POA: Diagnosis not present

## 2017-02-14 DIAGNOSIS — E119 Type 2 diabetes mellitus without complications: Secondary | ICD-10-CM | POA: Diagnosis not present

## 2017-02-14 DIAGNOSIS — C50411 Malignant neoplasm of upper-outer quadrant of right female breast: Secondary | ICD-10-CM | POA: Diagnosis not present

## 2017-02-14 DIAGNOSIS — R21 Rash and other nonspecific skin eruption: Secondary | ICD-10-CM | POA: Diagnosis not present

## 2017-04-11 ENCOUNTER — Other Ambulatory Visit: Payer: Self-pay | Admitting: *Deleted

## 2017-04-11 MED ORDER — AZITHROMYCIN 250 MG PO TABS: ORAL_TABLET | ORAL | 0 refills | Status: DC

## 2017-04-14 ENCOUNTER — Other Ambulatory Visit: Payer: Self-pay

## 2017-04-14 DIAGNOSIS — Z170 Estrogen receptor positive status [ER+]: Principal | ICD-10-CM

## 2017-04-14 DIAGNOSIS — C50411 Malignant neoplasm of upper-outer quadrant of right female breast: Secondary | ICD-10-CM

## 2017-04-21 ENCOUNTER — Other Ambulatory Visit: Payer: No Typology Code available for payment source

## 2017-04-21 ENCOUNTER — Ambulatory Visit: Payer: No Typology Code available for payment source | Admitting: Oncology

## 2017-04-21 ENCOUNTER — Inpatient Hospital Stay: Payer: No Typology Code available for payment source

## 2017-04-21 ENCOUNTER — Inpatient Hospital Stay: Payer: No Typology Code available for payment source | Admitting: Oncology

## 2017-05-05 ENCOUNTER — Inpatient Hospital Stay: Payer: Medicare HMO | Attending: Oncology

## 2017-05-05 ENCOUNTER — Inpatient Hospital Stay (HOSPITAL_BASED_OUTPATIENT_CLINIC_OR_DEPARTMENT_OTHER): Payer: Medicare HMO | Admitting: Oncology

## 2017-05-05 VITALS — BP 122/76 | HR 73 | Temp 98.4°F | Resp 12 | Ht 66.0 in | Wt 191.2 lb

## 2017-05-05 DIAGNOSIS — I10 Essential (primary) hypertension: Secondary | ICD-10-CM | POA: Insufficient documentation

## 2017-05-05 DIAGNOSIS — N879 Dysplasia of cervix uteri, unspecified: Secondary | ICD-10-CM | POA: Diagnosis not present

## 2017-05-05 DIAGNOSIS — J449 Chronic obstructive pulmonary disease, unspecified: Secondary | ICD-10-CM | POA: Diagnosis not present

## 2017-05-05 DIAGNOSIS — Z79811 Long term (current) use of aromatase inhibitors: Secondary | ICD-10-CM | POA: Insufficient documentation

## 2017-05-05 DIAGNOSIS — Z87891 Personal history of nicotine dependence: Secondary | ICD-10-CM | POA: Diagnosis not present

## 2017-05-05 DIAGNOSIS — C50411 Malignant neoplasm of upper-outer quadrant of right female breast: Secondary | ICD-10-CM

## 2017-05-05 DIAGNOSIS — Z923 Personal history of irradiation: Secondary | ICD-10-CM | POA: Insufficient documentation

## 2017-05-05 DIAGNOSIS — Z170 Estrogen receptor positive status [ER+]: Secondary | ICD-10-CM

## 2017-05-05 DIAGNOSIS — M8580 Other specified disorders of bone density and structure, unspecified site: Secondary | ICD-10-CM

## 2017-05-05 DIAGNOSIS — E785 Hyperlipidemia, unspecified: Secondary | ICD-10-CM | POA: Diagnosis not present

## 2017-05-05 DIAGNOSIS — E119 Type 2 diabetes mellitus without complications: Secondary | ICD-10-CM | POA: Insufficient documentation

## 2017-05-05 LAB — CBC WITH DIFFERENTIAL/PLATELET
BASOS PCT: 1 %
Basophils Absolute: 0.1 10*3/uL (ref 0–0.1)
EOS ABS: 0.3 10*3/uL (ref 0–0.7)
EOS PCT: 4 %
HCT: 40.8 % (ref 35.0–47.0)
Hemoglobin: 14.1 g/dL (ref 12.0–16.0)
LYMPHS ABS: 1.7 10*3/uL (ref 1.0–3.6)
Lymphocytes Relative: 24 %
MCH: 32.8 pg (ref 26.0–34.0)
MCHC: 34.6 g/dL (ref 32.0–36.0)
MCV: 95 fL (ref 80.0–100.0)
MONOS PCT: 6 %
Monocytes Absolute: 0.4 10*3/uL (ref 0.2–0.9)
Neutro Abs: 4.7 10*3/uL (ref 1.4–6.5)
Neutrophils Relative %: 65 %
PLATELETS: 265 10*3/uL (ref 150–440)
RBC: 4.3 MIL/uL (ref 3.80–5.20)
RDW: 13.3 % (ref 11.5–14.5)
WBC: 7.2 10*3/uL (ref 3.6–11.0)

## 2017-05-05 LAB — COMPREHENSIVE METABOLIC PANEL
ALBUMIN: 4.2 g/dL (ref 3.5–5.0)
ALT: 24 U/L (ref 14–54)
ANION GAP: 6 (ref 5–15)
AST: 25 U/L (ref 15–41)
Alkaline Phosphatase: 82 U/L (ref 38–126)
BUN: 19 mg/dL (ref 6–20)
CHLORIDE: 100 mmol/L — AB (ref 101–111)
CO2: 33 mmol/L — AB (ref 22–32)
Calcium: 10.2 mg/dL (ref 8.9–10.3)
Creatinine, Ser: 0.72 mg/dL (ref 0.44–1.00)
GFR calc non Af Amer: >60 mL/min (ref >60)
Glucose, Bld: 138 mg/dL — ABNORMAL HIGH (ref 65–99)
Potassium: 3.3 mmol/L — ABNORMAL LOW (ref 3.5–5.1)
SODIUM: 139 mmol/L (ref 135–145)
Total Bilirubin: 0.5 mg/dL (ref 0.3–1.2)
Total Protein: 7.6 g/dL (ref 6.5–8.1)

## 2017-05-05 NOTE — Progress Notes (Signed)
Hematology/Oncology Consult note [facility]  [phone] [phone]   Sandra Burnett, [place] [address]  Date of Visit: 05/05/17  DIAGNOSIS: stage I ER/PR HER-2/neu negative right breast cancer   Chief Complaint/Reason for Visit:   SUMMARY OF ONCOLOGIC HISTORY: 1. 2015- Patient is former employee of cancer center who presented for abnormal mammogram of right breast with suspicious mass at the 9:00 position, irregular and spiculated 7cm from nipple measuring 4x3x74m. Confirmed on ultrasound. Biopsy positive for invasive mammary carcinoma. Underwent a wide local excision and sentinel node biopsy. Tumor was 0.8cm ER/PR positive HER-2/neu negative. Margins were clear 3.558m There was DCIS present although margins were clear for both DCIS and invasive component. stage I (T1b N0 M0) ER/PR positive HER-2/neu-NEG invasive mammary carcinoma status post wide local excision; SLNBx [Dr.Byrnett] status post mammosite (06/2014) 2. Started on Femara in December of 2015 and completed accelerated partial breast radiation to her right breast 3. SEP 2016- BMD- OSTEOPENIA. Patient initiated calcium and vitamin d daily. Repeat in 2 years. 4. Serenidy 2017- patient developed hypercalcemia and dosage of calcium was decreased.  5. October 2017- MM bi-rads category 2: benign. Repeat in 1 year.  CURRENT THERAPY:  INTERVAL HISTORY: Sandra Burnett 89.o. female returns for follow-up. She has a history of stage 1 breast cancer in 2015. She was started on letrozole 07/2014. Throughout her history of taking letrozole she has had hot flashes and bone pain. Her hot flashes do not wake her up at night. She reports them as 'bothersome' but says she's 'learned to live with them'. Previously she has taken a drug holiday from letrozole and her symptoms did not improve during that time. She wants to avoid tamoxifen due to a family  history of blood clots and cervical neoplasia. She has continued bone pain but feels this is related to arthritis in her back and shoulders. She says her hands hurt 'every day'.  Her last bone density scan was 2018 which revealed osteopenia. She continues calcium and vitamin d. Last follow up with Dr. ChBaruch Goutyas 08/18/16. Last follow-up with Dr. ByBary Castillaas 05/25/16.   ECOG- 1 Pain Scale- 0  Patient Active Problem List   Diagnosis Date Noted  . 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 CAFL (chronic airflow limitation) (HCC[place]09/19/2016  . Diabetes mellitus, type 2 (HC[place]09/19/2016  . BP (high blood pressure) 05/05/2015  . HLD (hyperlipidemia) 05/05/2015  . Arthritis, degenerative 05/05/2015  . Psoriasis 05/05/2015  . Leg ulcer (HCMorenci12/31/2015  . Seroma complicating a procedure 06/18/2014  . Hypokalemia 06/10/2014  . Abdominal pain, chronic, right lower quadrant 06/10/2014  . Carcinoma of upper-outer quadrant of right breast in female, estrogen receptor positive (HC[place]rodes [place]10/13/2015  . Fothergill's neuralgia 01/15/2004    is allergic to lisinopril; other; and codeine.  MEDICAL HISTORY: Past Medical History:  Diagnosis Date  . Asthma   . Breast cancer ([facility]   2015- mammosite   . COPD (chronic obstructive pulmonary disease) (HCLargo  . Diabetes mellitus without complication ([facility]  . Hyperlipidemia   . Hypertension   . Osteopenia   . Osteoporosis   . Psoriasis     SURGICAL HISTORY: Past Surgical History:  Procedure Laterality Date  . ABDOMINAL HYSTERECTOMY  1970's   partial  . APPENDECTOMY    . BREAST BIOPSY Bilateral 1980's  . BREAST BIOPSY Right 2015   positive  . BREAST SURGERY Right 06/13/14  right breast wide excision  . carpel tunnel Bilateral 1984  . CHOLECYSTECTOMY  1984  . COLONOSCOPY  2008   Dr Kristopher Glee  . TUBAL LIGATION      SOCIAL HISTORY: Social History   Social History  . Marital status: Married    Spouse name: N/A  . Number of  children: N/A  . Years of education: N/A   Occupational History  . Not on file.   Social History Main Topics  . Smoking status: Former Smoker    Packs/day: 1.00    Years: 40.00  . Smokeless tobacco: Never Used  . Alcohol use No  . Drug use: No  . Sexual activity: Not on file   Other Topics Concern  . Not on file   Social History Narrative  . No narrative on file    FAMILY HISTORY: Family History  Problem Relation Age of Onset  . Pancreatitis Father   . Cancer Paternal Grandmother        breast  . Breast cancer Paternal Grandmother   . Breast cancer Paternal Aunt     Review of Systems  Constitutional: Negative for appetite change, chills, fatigue and fever.  HENT:   Negative for lump/mass and mouth sores.   Eyes: Negative for eye problems.  Respiratory: Negative for chest tightness, cough and wheezing.   Cardiovascular: Negative for chest pain, leg swelling and palpitations.  Gastrointestinal: Negative for abdominal distention, blood in stool, constipation, diarrhea, nausea and vomiting.  Endocrine: Positive for hot flashes.  Genitourinary: Negative for difficulty urinating, dysuria and hematuria.   Musculoskeletal: Positive for arthralgias, back pain and neck pain.  Skin: Negative for rash.  Neurological: Negative for dizziness, headaches and numbness.  Hematological: Does not bruise/bleed easily.  Psychiatric/Behavioral: The patient is not nervous/anxious.      PHYSICAL EXAMINATION   Vitals:   05/05/17 1209  BP: 122/76  Pulse: 73  Resp: 12  Temp: 98.4 F (36.9 C)    Physical Exam  Constitutional: She is oriented to person, place, and time and well-developed, well-nourished, and in no distress.  HENT:  Head: Normocephalic and atraumatic.  Neck: Normal range of motion. Neck supple.  Cardiovascular: Normal rate and regular rhythm.   Pulmonary/Chest: Effort normal and breath sounds normal. She has no wheezes. Right breast exhibits skin change (modest  thickening at well healed surgical scar). Right breast exhibits no mass and no tenderness. Left breast exhibits no mass, no skin change and no tenderness.  Abdominal: Soft. Bowel sounds are normal. She exhibits no distension. There is no tenderness.  Musculoskeletal: She exhibits no edema.  Lymphadenopathy:    She has no cervical adenopathy.  Neurological: She is alert and oriented to person, place, and time.  Skin: Skin is warm and dry. No rash noted. No erythema.  Psychiatric: Affect normal.    LABORATORY DATA:  CBC    Component Value Date/Time   WBC 7.2 05/05/2017 1140   RBC 4.30 05/05/2017 1140   HGB 14.1 05/05/2017 1140   HGB 12.9 12/24/2011 0847   HCT 40.8 05/05/2017 1140   HCT 38.4 12/24/2011 0847   PLT 265 05/05/2017 1140   PLT 205 12/24/2011 0847   MCV 95.0 05/05/2017 1140   MCV 97 12/24/2011 0847   MCH 32.8 05/05/2017 1140   MCHC 34.6 05/05/2017 1140   RDW 13.3 05/05/2017 1140   RDW 13.4 12/24/2011 0847   LYMPHSABS 1.7 05/05/2017 1140   LYMPHSABS 1.8 12/24/2011 0847   MONOABS 0.4 05/05/2017 1140   MONOABS 0.4  12/24/2011 0847   EOSABS 0.3 05/05/2017 1140   EOSABS 0.2 12/24/2011 0847   BASOSABS 0.1 05/05/2017 1140   BASOSABS 0.0 12/24/2011 0847    CMP     Component Value Date/Time   NA 139 05/05/2017 1140   NA 139 06/06/2014 1022   K 3.3 (L) 05/05/2017 1140   K 3.3 (L) 06/06/2014 1022   CL 100 (L) 05/05/2017 1140   CL 103 06/06/2014 1022   CO2 33 (H) 05/05/2017 1140   CO2 30 06/06/2014 1022   GLUCOSE 138 (H) 05/05/2017 1140   GLUCOSE 184 (H) 06/06/2014 1022   BUN 19 05/05/2017 1140   BUN 20 (H) 06/06/2014 1022   CREATININE 0.72 05/05/2017 1140   CREATININE 0.86 06/06/2014 1022   CALCIUM 10.2 05/05/2017 1140   CALCIUM 9.1 06/06/2014 1022   PROT 7.6 05/05/2017 1140   PROT 7.3 12/24/2011 0847   ALBUMIN 4.2 05/05/2017 1140   ALBUMIN 3.7 12/24/2011 0847   AST 25 05/05/2017 1140   AST 27 12/24/2011 0847   ALT 24 05/05/2017 1140   ALT 30 12/24/2011  0847   ALKPHOS 82 05/05/2017 1140   ALKPHOS 72 12/24/2011 0847   BILITOT 0.5 05/05/2017 1140   BILITOT 0.3 12/24/2011 0847   GFRNONAA >60 05/05/2017 1140   GFRNONAA >60 06/06/2014 1022   GFRNONAA >60 12/24/2011 0847   GFRAA >60 05/05/2017 1140   GFRAA >60 06/06/2014 1022   GFRAA >60 12/24/2011 0847    RADIOGRAPHIC STUDIES:  No results found.   PATHOLOGY: Surgical pathology  Order: [id number]  Status:  Final result Visible to patient:  Yes (MyChart) Next appt:  06/16/2017 at 09:00 AM in Radiology ([facility]-DG DEXA 1)  Component 24yrago  SURGICAL PATHOLOGY Surgical Procedure  CASE: [id number]  PATIENT: JKeitha Butte Surgical Pathology Report   SPECIMEN SUBMITTED:  A. Lymph node #1 and #2, sentinel  B. Breast, right, lumpectomy, 9:00, wide excision   CLINICAL HISTORY:  None provided   PRE-OPERATIVE DIAGNOSIS:  None Provided   POST-OPERATIVE DIAGNOSIS:  None provided   DIAGNOSIS:  A. SENTINEL LYMPH NODE #1 AND 2; EXCISION:  - TWO SENTINEL LYMPH NODES, NEGATIVE FOR MACRO METASTASES (0/2).   B. RIGHT BREAST, 9:00; WIDE EXCISION:  - INVASIVE MAMMARY CARCINOMA, DUCTAL TYPE.  - BIOPSY SITE CHANGES, MARKER CLIP PRESENT.  - THE MARGINS OF EXCISION ARE NEGATIVE (CLOSEST MARGIN IS MEDIAL).  - DUCTAL CARCINOMA IN SITU, NUCLEAR GRADE 1, CRIBRIFORM TYPE.  - SEE CANCER CASE SUMMARY.   INVASIVE CARCINOMA OF THE BREAST: Complete Excision (Less Than Total  Mastectomy, Including Specimens Designated Biopsy, Lumpectomy,  Quadrantectomy, and Partial Mastectomy With or Without Axillary  Contents) and Mastectomy (Total, Modified Radical, Radical With or  Without Axillary Contents)Radical, Radical With or Without Axillary  Contents)  Specimens InvolvedB: Breast, right, lumpectomy, 9:00, wide excision   Breast Invasive Carcinoma Cancer Case Summary  SPECIMEN  Procedure:   Excision without wire-guided localization  Lymph Node Sampling:   Sentinel lymph node(s)  Specimen  Laterality:   Right  TUMOR  Presence of Invasive Carcinoma:  Histologic Type of Invasive  Carcinoma:  Invasive ductal carcinoma (no special type or not otherwise specified)  Histologic Grade: Nottingham Histologic Score  Glandular (Acinar) / Tubular Differentiation:  Score 1: > 75% of tumor area forming glandular / tubular structures  Nuclear Pleomorphism:  Score 1: Nuclei small with little increase in size in comparison with  normal breast epithelial cells, regular outlines, uniform nuclear  chromatin, little variation in size  Mitotic  Rate  Score 1 (<=3 mitoses per mm2)  Overall Grade: Grade 1: scores of 3, 4 or 5  Ductal Carcinoma In Situ (DCIS):  DCIS is present  EXTENT  Tumor Size / Focality:  Tumor Size: Size of Largest Invasive Carcinoma:  Greatest dimension of largest focus of invasion > 1 mm  Greatest Dimension (mm): 0.8cm  MARGINS  Invasive Carcinoma: Margins uninvolved by invasive carcinoma  Distance from Closest Margin (mm):   Distance (specify in mm)  3.79m  Ductal Carcinoma In Situ (DCIS):  Margins uninvolved by DCIS (DCIS  present in specimen)  Distance of DCIS from Closest Margin (mm):  Distance (specify in mm)  556m LYMPH NODES  Status of Lymph Nodes:  Total Number of Lymph Nodes Examined:  Specify  2  Micro / Macro Metastases:   Not identified  0  Number of Lymph Nodes with Isolated Tumor Cells (<= 0.2 mm and <= 200  cells):  None  Number of Sentinel Lymph Nodes Examined:   Specify  2  ACCESSORY FINDINGS  Lymph-Vascular Invasion: Not Identified  STAGE (pTNM)  TNM Descriptors:  Not applicable  Primary Tumor (Invasive Carcinoma) (pT):  pT1b: Tumor > 5 mm but <= 10 mm in greatest dimension  Regional Lymph Nodes (pN) (choose a category based on lymph nodes  received with the specimen; immunohistochemistry and / or molecular  studies are not required)  Category (pN): pN0: No regional lymph node metastasis identified  histologically    Note: ER/PR and HER 2 IHC was performed on a separate specimen. In  summary:  ER: positive  PR: positive  HER2: negative    GROSS DESCRIPTION:  A. Intra Operative Consultation:    Received: Fresh    Specimen: Sentinel node #1 #2    Pathologic Evaluation: Frozen section    Diagnosis: Frozen section: A1-A2: Sentinel lymph node #1 and #2: No  evidence of macro metastases in 2 sentinel lymph nodes (0/2).    Communicated to: Dr. ByBary Castillat 10:44 AM on 06/13/2014    Tissue submitted: A1-A2   A. Labeled: Sentinel lymph node #1 and #2  Tissue Fragment(s): 2  Measurement: 2.1 x 1.8 x 0.8 cm and 2.7 x 2.2 x 0.8 cm  Comment: 2 pieces of pale yellow fatty tissue, upon dissection 2 lymph  nodes candidates are noted, 0.5 x 0.4 x 0.3 cm and 1.5 x 1 x 0.4 cm.   Entirely submitted (except for attached adipose tissue) in cassette(s):  A1: smaller lymph node, A2: larger lymph node. Total 2 cassettes   B. Intra Operative Consultation:    Received: Fresh    Specimen: Right breast 9:00 wide excision    Pathologic Evaluation: Margin evaluation with call back    Diagnosis: IOC: Right breast at 9:00, wide excision: Marker  identified, is 0.6 cm from superficial/medial margins.    Communicated to: Dr. ByBary Castillat 10:49 AM on 06/13/2014    Tissue submitted: None   Labeled: Right breast 9:00 wide excision   Time in fixative:  8 hours   Cold Ischemic Time: 22 minutes   Type of specimen: Excisional   Location of specimen: Right breast 9:00   Size of specimen: 4.2 x 5.5 x 6.3 centimeters   Skin: None   Direction of compression: A-P  Needle localization: No, accompanying x-ray film with metallic clip  visualized.   Orientation of specimen: Orienting metallic markers designate medial,  lateral, and cranial.   Plane of sectioning: A-P.   Biopsy site: 0.2 x 0.2 x  0.3 cm, prosthetic markers identified   Presence/absence of discrete mass: Absent, fibrous tissue  surrounding  biopsy cavity   Description of mass(es): Tissue adjacent to biopsy cavity   Distance between masses/clips: N./A.   Distance of mass(es)/ biopsy site/clip to surgical margins: Biopsy  cavity is grossly located 0.6 cm away from superficial skin and medial  margins of resection.  Description of remainder of tissue: Rest of the breast tissue is  primarily fatty with a small amount of yellow discoloration possibly fat  necrosis adjacent to biopsy cavity.   Other remarkable features: None   Tissue submitted for special investigation: None   Block summary:  1-3: entire biopsy cavity with overlying perpendicular superficial skin  margin.  4: Section of possible necrosis  5: Representative perpendicular deep margin  6: Representative perpendicular cranial margin  7: Representative perpendicular caudal margin  8: Representativeperpendicular medial margin  9: Representative perpendicular lateral margin  Total 9 cassettes.   Superior = blue  Inferior = green  Medial = yellow  Lateral = orange  Posterior = black  Anterior/Superficial = red   Final Diagnosis performed by Glenice Bow, MD. Electronically signed  06/14/2014 1:36:52PM     The electronic signature indicates that the named Attending Pathologist  has evaluated the specimen   Technical component performed at [facility], [address] [place],  [place] [address]  Lab: [phone] Dir: Titus Dubin. Cato Mulligan, MD   Professional component performed at [facility], [facility],  [address] [place], [place], [place] [address]  Lab: [phone] Dir: Georgiann Cocker. Rubinas, MD         no  ASSESSMENT and PLAN: Patient has history of stage I ER PR positive HER-2 negative breast cancer status post lumpectomy followed by mammosite radiation currently on adjuvant Femara. Clinically no evidence of recurrence. Last mammogram October 2017 was BI-RADS Category 2 with recommendation of 1 year follow-up. Patient  continues to have significant arthralgias, which may be related to AI. Her symptoms did not improve with a 1 month drug holiday and she refuses to consider tamoxifen due to concerns for DVT. Refuses Aromasin due to cost. She feels that while uncomfortable, the benefits of the AI outweighs her symptoms and she would like to continue the Femara. Recommended that she follow back up with Dr. Rushie Chestnut January 2019. He last saw surgery, Dr. Lemar Livings, in October 2017. No follow-up scheduled at this time however, patient has mammogram scheduled for 06/16/2017, ordered by surgery. Additionally she has a bone density scan for the same day.   All questions were answered. The patient knows to call the clinic with any problems, questions or concerns. We can certainly see the patient much sooner if necessary.  Will plan to see her back in 6 months with labs and further evaluation.  Orders Placed This Encounter  Procedures  . DG Bone Density    Standing Status:   Future    Standing Expiration Date:   05/05/2018    Order Specific Question:   Reason for Exam (SYMPTOM  OR DIAGNOSIS REQUIRED)    Answer:   osteopenia. on femara    Order Specific Question:   Preferred imaging location?    Answer:   [facility]  . CBC with Differential    Standing Status:   Future    Standing Expiration Date:   05/05/2022  . Comprehensive metabolic panel    Standing Status:   Future    Standing Expiration Date:   05/05/2022    Margrett Rud. Freida Busman,  NP 05/06/17 9:26 AM  Verlon Au, NP 05/05/2017

## 2017-05-05 NOTE — Progress Notes (Signed)
Patient here for follow up. No changes since last appointment.  

## 2017-05-18 DIAGNOSIS — L821 Other seborrheic keratosis: Secondary | ICD-10-CM | POA: Diagnosis not present

## 2017-05-18 DIAGNOSIS — L400 Psoriasis vulgaris: Secondary | ICD-10-CM | POA: Diagnosis not present

## 2017-05-31 ENCOUNTER — Other Ambulatory Visit: Payer: No Typology Code available for payment source

## 2017-06-16 ENCOUNTER — Ambulatory Visit
Admission: RE | Admit: 2017-06-16 | Discharge: 2017-06-16 | Disposition: A | Discharge status: Home / Self Care | Payer: Medicare HMO | Source: Ambulatory Visit | Attending: General Surgery | Admitting: General Surgery

## 2017-06-16 ENCOUNTER — Ambulatory Visit
Admission: RE | Admit: 2017-06-16 | Discharge: 2017-06-16 | Disposition: A | Discharge status: Home / Self Care | Payer: Medicare HMO | Source: Ambulatory Visit | Attending: Oncology | Admitting: Oncology

## 2017-06-16 DIAGNOSIS — Z170 Estrogen receptor positive status [ER+]: Principal | ICD-10-CM

## 2017-06-16 DIAGNOSIS — M8580 Other specified disorders of bone density and structure, unspecified site: Secondary | ICD-10-CM

## 2017-06-16 DIAGNOSIS — M8588 Other specified disorders of bone density and structure, other site: Secondary | ICD-10-CM | POA: Insufficient documentation

## 2017-06-16 DIAGNOSIS — Z780 Asymptomatic menopausal state: Secondary | ICD-10-CM | POA: Diagnosis not present

## 2017-06-16 DIAGNOSIS — C50411 Malignant neoplasm of upper-outer quadrant of right female breast: Secondary | ICD-10-CM | POA: Diagnosis not present

## 2017-06-16 DIAGNOSIS — M8589 Other specified disorders of bone density and structure, multiple sites: Secondary | ICD-10-CM | POA: Diagnosis not present

## 2017-06-16 DIAGNOSIS — R928 Other abnormal and inconclusive findings on diagnostic imaging of breast: Secondary | ICD-10-CM | POA: Diagnosis not present

## 2017-06-23 ENCOUNTER — Encounter: Payer: Self-pay | Admitting: General Surgery

## 2017-06-23 ENCOUNTER — Ambulatory Visit: Payer: Medicare HMO | Admitting: General Surgery

## 2017-06-23 VITALS — BP 130/68 | HR 69 | Resp 16 | Ht 66.0 in | Wt 194.0 lb

## 2017-06-23 DIAGNOSIS — C50411 Malignant neoplasm of upper-outer quadrant of right female breast: Secondary | ICD-10-CM | POA: Diagnosis not present

## 2017-06-23 NOTE — Progress Notes (Signed)
Patient ID: [name], female   DOB: Mar 03, 1946, 71 y.o.   MRN: [id number]  Chief Complaint  Patient presents with  . Follow-up    HPI Sandra Burnett is a 71 y.o. female who presents for a breast evaluation. The most recent mammogram was done on 06/16/2017.  Patient does perform regular self breast checks and gets regular mammograms done.  Patient states she see bright red blood on the paper not in the bowl.Has a internal hemorrhoid. Moves her bowels daily.   HPI  Past Medical History:  Diagnosis Date  . Asthma   . Breast cancer ([place]) right   2015- mammosite   . COPD (chronic obstructive pulmonary disease) ([place])   . Diabetes mellitus without complication ([place])   . Hyperlipidemia   . Hypertension   . Osteopenia   . Osteoporosis   . Psoriasis     Past Surgical History:  Procedure Laterality Date  . ABDOMINAL HYSTERECTOMY  1970's   partial  . APPENDECTOMY    . BREAST BIOPSY Bilateral 1980's  . BREAST BIOPSY Right 2015   positive  . BREAST SURGERY Right 06/13/14   right breast wide excision  . carpel tunnel Bilateral 1984  . CHOLECYSTECTOMY  1984  . COLONOSCOPY  2008   Dr Kristopher Glee  . TUBAL LIGATION      Family History  Problem Relation Age of Onset  . Pancreatitis Father   . Cancer Paternal Grandmother        breast  . Breast cancer Paternal Grandmother   . Breast cancer Paternal Aunt     Social History Social History   Tobacco Use  . Smoking status: Former Smoker    Packs/day: 1.00    Years: 40.00    Pack years: 40.00  . Smokeless tobacco: Never Used  Substance Use Topics  . Alcohol use: No  . Drug use: No    Allergies  Allergen Reactions  . Lisinopril Other (See Comments)    Severe hypotension  . Other Anaphylaxis and Other (See Comments)    Yellow dye Elevated LFTs "Yellow Dye"  . Codeine Nausea And Vomiting and Nausea Only    Other reaction(s): Vomiting    Current Outpatient Medications  Medication Sig Dispense Refill  . albuterol  (VENTOLIN HFA) 108 (90 Base) MCG/ACT inhaler     . Calcium Carbonate-Vit D-Min (CALTRATE 600+D PLUS PO) Take by mouth daily.     Marland Kitchen EPINEPHrine 0.3 mg/0.3 mL IJ SOAJ injection Inject 0.3 mg into the muscle once.    . Fluticasone-Salmeterol (ADVAIR) 100-50 MCG/DOSE AEPB INHALE ONE PUFF 2 TIMES A DAY AS DIRECTED    . hydrochlorothiazide (HYDRODIURIL) 25 MG tablet Take 25 mg by mouth daily.    Marland Kitchen letrozole (FEMARA) 2.5 MG tablet Take 1 tablet (2.5 mg total) by mouth daily. 90 tablet 3  . lovastatin (MEVACOR) 40 MG tablet Take 40 mg by mouth at bedtime.    . meloxicam (MOBIC) 15 MG tablet     . metFORMIN (GLUCOPHAGE) 500 MG tablet TAKE ONE TABLET BY MOUTH 2 TIMES A DAY    . metoprolol tartrate (LOPRESSOR) 25 MG tablet Take 25 mg by mouth 2 (two) times daily.    . Multiple Vitamin (MULTIVITAMIN) capsule Take 1 capsule by mouth daily.    . Omega-3 Fatty Acids (FISH OIL) 1000 MG CAPS Take by mouth daily.    Marland Kitchen tiotropium (SPIRIVA) 18 MCG inhalation capsule Place 18 mcg into inhaler and inhale daily.    . traMADol (ULTRAM) 50 MG  tablet Take 50 mg by mouth every 6 (six) hours as needed.    . venlafaxine XR (EFFEXOR-XR) 150 MG 24 hr capsule Take 150 mg by mouth daily with breakfast.     No current facility-administered medications for this visit.     Review of Systems Review of Systems  Constitutional: Negative.   Respiratory: Positive for shortness of breath.   Cardiovascular: Negative.     Blood pressure 130/68, pulse 69, resp. rate 16, height 5\' 6"  (1.676 m), weight 194 lb (88 kg), SpO2 90 %.  Physical Exam Physical Exam  Constitutional: She is oriented to person, place, and time. She appears well-developed and well-nourished.  Eyes: Conjunctivae are normal. No scleral icterus.  Neck: Neck supple.  Cardiovascular: Normal rate, regular rhythm and normal heart sounds.  Pulmonary/Chest: Effort normal and breath sounds normal. Right breast exhibits no inverted nipple, no mass, no nipple  discharge, no skin change and no tenderness. Left breast exhibits no inverted nipple, no mass, no nipple discharge, no skin change and no tenderness.    Lymphadenopathy:    She has no cervical adenopathy.    She has no axillary adenopathy.  Neurological: She is alert and oriented to person, place, and time.  Skin: Skin is warm and dry.    Data Reviewed Bilateral diagnostic mammograms dated 06/16/2017 were reviewed. BIRAD-2.   Bone density testing dated 06/16/2017 shows a change from normal to ostial PDA for both the spine and femoral neck.  Assessment    No evidence of recurrent breast cancer.  Development of osteopenia likely secondary to aromatase inhibitor.  Candidate for screening colonoscopy.    Plan    The patient reports terrible experience with her 2008 colonoscopy done with IV sedation. She reports significant pain both during the procedure as well as for days afterwards.  She has no family history of colon cancer or polyps and would be a candidate for Cologuard screening.  She is amenable to having a Cologuard test on recognizing that if positive she should proceed to colonoscopy.    The patient has been asked to return to the office in one year with a bilateral diagnostic mammogram.    Management of osteopenia as per Dr.Brahmanday or Dr. Caryl Comes.   Orders and forms faxed to [facility]   The patient is aware to call back for any questions or concerns.    HPI, Physical Exam, Assessment and Plan have been scribed under the direction and in the presence of Hervey Ard, MD.  Gaspar Cola, CMA  I have completed the exam and reviewed the above documentation for accuracy and completeness.  I agree with the above.  Haematologist has been used and any errors in dictation or transcription are unintentional.  Hervey Ard, M.D., F.A.C.S.  Robert Bellow 06/23/2017, 9:11 PM

## 2017-06-23 NOTE — Patient Instructions (Addendum)
The patient has been asked to return to the office in one year with a bilateral diagnostic mammogram.  Patient to have color guard sent to her house.

## 2017-07-05 DIAGNOSIS — L400 Psoriasis vulgaris: Secondary | ICD-10-CM | POA: Diagnosis not present

## 2017-07-22 DIAGNOSIS — M150 Primary generalized (osteo)arthritis: Secondary | ICD-10-CM | POA: Diagnosis not present

## 2017-07-22 DIAGNOSIS — E119 Type 2 diabetes mellitus without complications: Secondary | ICD-10-CM | POA: Diagnosis not present

## 2017-07-22 DIAGNOSIS — F325 Major depressive disorder, single episode, in full remission: Secondary | ICD-10-CM | POA: Diagnosis not present

## 2017-07-22 DIAGNOSIS — C50411 Malignant neoplasm of upper-outer quadrant of right female breast: Secondary | ICD-10-CM | POA: Diagnosis not present

## 2017-07-22 DIAGNOSIS — E7849 Other hyperlipidemia: Secondary | ICD-10-CM | POA: Diagnosis not present

## 2017-07-22 DIAGNOSIS — I10 Essential (primary) hypertension: Secondary | ICD-10-CM | POA: Diagnosis not present

## 2017-07-22 DIAGNOSIS — E538 Deficiency of other specified B group vitamins: Secondary | ICD-10-CM | POA: Diagnosis not present

## 2017-07-22 DIAGNOSIS — J449 Chronic obstructive pulmonary disease, unspecified: Secondary | ICD-10-CM | POA: Diagnosis not present

## 2017-07-22 DIAGNOSIS — I700 Atherosclerosis of aorta: Secondary | ICD-10-CM | POA: Diagnosis not present

## 2017-08-02 DIAGNOSIS — Z1211 Encounter for screening for malignant neoplasm of colon: Secondary | ICD-10-CM

## 2017-08-02 DIAGNOSIS — Z1212 Encounter for screening for malignant neoplasm of rectum: Secondary | ICD-10-CM | POA: Diagnosis not present

## 2017-08-02 HISTORY — DX: Encounter for screening for malignant neoplasm of colon: Z12.11

## 2017-08-13 LAB — COLOGUARD

## 2017-08-16 ENCOUNTER — Encounter: Payer: Self-pay | Admitting: General Surgery

## 2017-08-17 ENCOUNTER — Encounter: Payer: Self-pay | Admitting: General Surgery

## 2017-08-18 ENCOUNTER — Other Ambulatory Visit: Payer: Self-pay | Admitting: *Deleted

## 2017-08-18 ENCOUNTER — Encounter: Payer: Self-pay | Admitting: Radiation Oncology

## 2017-08-18 ENCOUNTER — Ambulatory Visit
Admission: RE | Admit: 2017-08-18 | Discharge: 2017-08-18 | Disposition: A | Discharge status: Home / Self Care | Payer: Medicare HMO | Source: Ambulatory Visit | Attending: Radiation Oncology | Admitting: Radiation Oncology

## 2017-08-18 ENCOUNTER — Other Ambulatory Visit: Payer: Self-pay

## 2017-08-18 VITALS — BP 143/83 | HR 69 | Temp 98.6°F | Resp 20 | Wt 195.9 lb

## 2017-08-18 DIAGNOSIS — C50411 Malignant neoplasm of upper-outer quadrant of right female breast: Secondary | ICD-10-CM | POA: Insufficient documentation

## 2017-08-18 DIAGNOSIS — Z79811 Long term (current) use of aromatase inhibitors: Secondary | ICD-10-CM | POA: Insufficient documentation

## 2017-08-18 DIAGNOSIS — Z170 Estrogen receptor positive status [ER+]: Secondary | ICD-10-CM | POA: Insufficient documentation

## 2017-08-18 DIAGNOSIS — Z923 Personal history of irradiation: Secondary | ICD-10-CM | POA: Diagnosis not present

## 2017-08-18 NOTE — Progress Notes (Signed)
Radiation Oncology Follow up Note  Name: Sandra Burnett   Date:   08/18/2017 MRN:  [id number] DOB: 21-Jan-1946    This 72 y.o. female presents to the clinic today for 3 year follow-up status post accelerated partial breast irradiation to her right breast for stage I ER/PR positive invasive mammary carcinoma.  REFERRING PROVIDER: Adin Hector, MD  HPI: Patient is a 72 year old female now out 3 years having completed accelerated partial breast radiation to her right breast for stage I ER/PR positive invasive mammary carcinoma. She is seen today in routine follow-up and is doing well. She specifically denies breast tenderness cough or bone pain.. Last mammogram performed in November which I have reviewed was BI-RADS 2 benign. She's currently on letrozole tolerating that well without side effect.  COMPLICATIONS OF TREATMENT: none  FOLLOW UP COMPLIANCE: keeps appointments   PHYSICAL EXAM:  BP (!) 143/83   Pulse 69   Temp 98.6 F (37 C)   Resp 20   Wt 195 lb 14.1 oz (88.9 kg)   BMI 31.62 kg/m  Lungs are clear to A&P cardiac examination essentially unremarkable with regular rate and rhythm. No dominant mass or nodularity is noted in either breast in 2 positions examined. Incision is well-healed. No axillary or supraclavicular adenopathy is appreciated. Cosmetic result is excellent. Well-developed well-nourished patient in NAD. HEENT reveals PERLA, EOMI, discs not visualized.  Oral cavity is clear. No oral mucosal lesions are identified. Neck is clear without evidence of cervical or supraclavicular adenopathy. Lungs are clear to A&P. Cardiac examination is essentially unremarkable with regular rate and rhythm without murmur rub or thrill. Abdomen is benign with no organomegaly or masses noted. Motor sensory and DTR levels are equal and symmetric in the upper and lower extremities. Cranial nerves II through XII are grossly intact. Proprioception is intact. No peripheral adenopathy or edema is  identified. No motor or sensory levels are noted. Crude visual fields are within normal range.  RADIOLOGY RESULTS: Mammograms are reviewed and compatible above-stated findings  PLAN: Present time she continues to do well with no evidence of disease. I'm please were overall progress. I've asked to see her back in 1 year for follow-up. She continues on letrozole without side effect. Patient is to call at anytime with any concerns.  I would like to take this opportunity to thank you for allowing me to participate in the care of your patient.Noreene Filbert, MD

## 2017-09-17 ENCOUNTER — Other Ambulatory Visit: Payer: Self-pay | Admitting: Internal Medicine

## 2017-11-03 ENCOUNTER — Inpatient Hospital Stay: Payer: Medicare HMO | Attending: Oncology

## 2017-11-03 ENCOUNTER — Encounter: Payer: Self-pay | Admitting: Oncology

## 2017-11-03 ENCOUNTER — Inpatient Hospital Stay (HOSPITAL_BASED_OUTPATIENT_CLINIC_OR_DEPARTMENT_OTHER): Payer: Medicare HMO | Admitting: Oncology

## 2017-11-03 VITALS — BP 134/85 | HR 73 | Temp 98.5°F | Resp 18 | Ht 66.0 in | Wt 196.5 lb

## 2017-11-03 DIAGNOSIS — M545 Low back pain, unspecified: Secondary | ICD-10-CM

## 2017-11-03 DIAGNOSIS — C50411 Malignant neoplasm of upper-outer quadrant of right female breast: Secondary | ICD-10-CM | POA: Diagnosis not present

## 2017-11-03 DIAGNOSIS — Z170 Estrogen receptor positive status [ER+]: Secondary | ICD-10-CM | POA: Diagnosis not present

## 2017-11-03 DIAGNOSIS — Z79811 Long term (current) use of aromatase inhibitors: Secondary | ICD-10-CM

## 2017-11-03 LAB — CBC WITH DIFFERENTIAL/PLATELET
BASOS ABS: 0.1 10*3/uL (ref 0–0.1)
BASOS PCT: 1 %
EOS PCT: 3 %
Eosinophils Absolute: 0.2 10*3/uL (ref 0–0.7)
HEMATOCRIT: 40.4 % (ref 35.0–47.0)
Hemoglobin: 13.4 g/dL (ref 12.0–16.0)
LYMPHS PCT: 26 %
Lymphs Abs: 1.7 10*3/uL (ref 1.0–3.6)
MCH: 32.2 pg (ref 26.0–34.0)
MCHC: 33.3 g/dL (ref 32.0–36.0)
MCV: 96.6 fL (ref 80.0–100.0)
Monocytes Absolute: 0.3 10*3/uL (ref 0.2–0.9)
Monocytes Relative: 5 %
NEUTROS ABS: 4.1 10*3/uL (ref 1.4–6.5)
Neutrophils Relative %: 65 %
PLATELETS: 239 10*3/uL (ref 150–440)
RBC: 4.18 MIL/uL (ref 3.80–5.20)
RDW: 13.4 % (ref 11.5–14.5)
WBC: 6.4 10*3/uL (ref 3.6–11.0)

## 2017-11-03 LAB — COMPREHENSIVE METABOLIC PANEL
ALBUMIN: 3.9 g/dL (ref 3.5–5.0)
ALT: 26 U/L (ref 14–54)
AST: 25 U/L (ref 15–41)
Alkaline Phosphatase: 77 U/L (ref 38–126)
Anion gap: 9 (ref 5–15)
BUN: 19 mg/dL (ref 6–20)
CHLORIDE: 102 mmol/L (ref 101–111)
CO2: 26 mmol/L (ref 22–32)
CREATININE: 0.79 mg/dL (ref 0.44–1.00)
Calcium: 9 mg/dL (ref 8.9–10.3)
GFR calc Af Amer: >60 mL/min (ref >60)
GLUCOSE: 139 mg/dL — AB (ref 65–99)
POTASSIUM: 3.9 mmol/L (ref 3.5–5.1)
Sodium: 137 mmol/L (ref 135–145)
Total Bilirubin: 0.4 mg/dL (ref 0.3–1.2)
Total Protein: 7.2 g/dL (ref 6.5–8.1)

## 2017-11-03 NOTE — Progress Notes (Signed)
Patient c/o increase pain to lower back and neck with pin and needles feeling. Pain level 6-7.

## 2017-11-04 NOTE — Progress Notes (Signed)
Hematology/Oncology Consult note [facility]  [phone] [phone]  Patient Care Team: Adin Hector, MD as PCP - General (Internal Medicine) Adin Hector, MD (Internal Medicine) Bary Castilla Forest Gleason, MD (General Surgery)   Name of the patient: Sandra Burnett  [id number]  1945/09/29   Date of visit: 11/04/17  Diagnosis- stage I ER/PR HER-2/neu negative right breast cancer   Chief complaint/ Reason for visit- routine f/u of breast cancer  Heme/Onc history:  Oncology History   2015- stage I (T1b N0 M0) ER/PR positive HER-2/neu-NEG invasive mammary carcinoma status post wide local excision; [facility]NBx [Dr.Byrnett] status post mammosite- November of 2015; Started on Femara in December of 2015.  # SEP 2016- BMD- OSTEOPENIA; mammo-NEG       Interval history- tolerating letrozole well. She is also on calcium and vit D. Reports low back pain that has been ongoing for past month or so. At times she Is unable to get a restful sleep because of the pain. She has tried tramadol for pain the past  ECOG PS- 1 Pain scale- 8  Review of systems- Review of Systems  Constitutional: Negative for chills, fever, malaise/fatigue and weight loss.  HENT: Negative for congestion, ear discharge and nosebleeds.   Eyes: Negative for blurred vision.  Respiratory: Negative for cough, hemoptysis, sputum production, shortness of breath and wheezing.   Cardiovascular: Negative for chest pain, palpitations, orthopnea and claudication.  Gastrointestinal: Negative for abdominal pain, blood in stool, constipation, diarrhea, heartburn, melena, nausea and vomiting.  Genitourinary: Negative for dysuria, flank pain, frequency, hematuria and urgency.  Musculoskeletal: Positive for back pain. Negative for joint pain and myalgias.  Skin: Negative for rash.  Neurological: Negative for dizziness, tingling, focal weakness, seizures, weakness and headaches.    Endo/Heme/Allergies: Does not bruise/bleed easily.  Psychiatric/Behavioral: Negative for depression and suicidal ideas. The patient does not have insomnia.       Allergies  Allergen Reactions  . Lisinopril Other (See Comments)    Severe hypotension  . Other Anaphylaxis and Other (See Comments)    Yellow dye Elevated LFTs "Yellow Dye"  . Codeine Nausea And Vomiting and Nausea Only    Other reaction(s): Vomiting     Past Medical History:  Diagnosis Date  . Asthma   . Breast cancer ([place]) right   2015- mammosite   . COPD (chronic obstructive pulmonary disease) ([place])   . Diabetes mellitus without complication ([place])   . Hyperlipidemia   . Hypertension   . Osteopenia   . Osteoporosis   . Psoriasis      Past Surgical History:  Procedure Laterality Date  . ABDOMINAL HYSTERECTOMY  1970's   partial  . APPENDECTOMY    . BREAST BIOPSY Bilateral 1980's  . BREAST BIOPSY Right 2015   positive  . BREAST SURGERY Right 06/13/2014   8 mm ER+, PR+, Her 2 not over expressed, upper outer quadrant. Mammosite radiation.  . carpel tunnel Bilateral 1984  . CHOLECYSTECTOMY  1984  . COLONOSCOPY  2008   Dr Kristopher Glee  . TUBAL LIGATION      Social History   Socioeconomic History  . Marital status: Married    Spouse name: Not on file  . Number of children: Not on file  . Years of education: Not on file  . Highest education level: Not on file  Occupational History  . Not on file  Social Needs  . Financial resource strain: Not on file  . Food insecurity:  Worry: Not on file    Inability: Not on file  . Transportation needs:    Medical: Not on file    Non-medical: Not on file  Tobacco Use  . Smoking status: Former Smoker    Packs/day: 1.00    Years: 40.00    Pack years: 40.00  . Smokeless tobacco: Never Used  Substance and Sexual Activity  . Alcohol use: No  . Drug use: No  . Sexual activity: Not on file  Lifestyle  . Physical activity:    Days per week: Not on file     Minutes per session: Not on file  . Stress: Not on file  Relationships  . Social connections:    Talks on phone: Not on file    Gets together: Not on file    Attends religious service: Not on file    Active member of club or organization: Not on file    Attends meetings of clubs or organizations: Not on file    Relationship status: Not on file  . Intimate partner violence:    Fear of current or ex partner: Not on file    Emotionally abused: Not on file    Physically abused: Not on file    Forced sexual activity: Not on file  Other Topics Concern  . Not on file  Social History Narrative  . Not on file    Family History  Problem Relation Age of Onset  . Pancreatitis Father   . Cancer Paternal Grandmother        breast  . Breast cancer Paternal Grandmother   . Breast cancer Paternal Aunt      Current Outpatient Medications:  .  albuterol (VENTOLIN HFA) 108 (90 Base) MCG/ACT inhaler, , Disp: , Rfl:  .  aspirin EC 81 MG tablet, Take by mouth., Disp: , Rfl:  .  Calcium Carbonate-Vit D-Min (CALTRATE 600+D PLUS PO), Take by mouth daily. , Disp: , Rfl:  .  clobetasol (TEMOVATE) 0.05 % external solution, USE 1 ML TWICE A DAY AS NEEDED. APPLY TO AFFECTED AREA ON SCALP. AVOID FACE/GROIN/AXILLA, Disp: , Rfl: 2 .  EPINEPHrine 0.3 mg/0.3 mL IJ SOAJ injection, Inject 0.3 mg into the muscle once., Disp: , Rfl:  .  Fluticasone-Salmeterol (ADVAIR) 100-50 MCG/DOSE AEPB, INHALE ONE PUFF 2 TIMES A DAY AS DIRECTED, Disp: , Rfl:  .  hydrochlorothiazide (HYDRODIURIL) 25 MG tablet, Take 25 mg by mouth daily., Disp: , Rfl:  .  letrozole (FEMARA) 2.5 MG tablet, TAKE 1 TABLET EVERY DAY, Disp: 90 tablet, Rfl: 3 .  lovastatin (MEVACOR) 40 MG tablet, Take 40 mg by mouth at bedtime., Disp: , Rfl:  .  meloxicam (MOBIC) 15 MG tablet, , Disp: , Rfl:  .  metFORMIN (GLUCOPHAGE) 500 MG tablet, TAKE ONE TABLET BY MOUTH 2 TIMES A DAY, Disp: , Rfl:  .  metoprolol tartrate (LOPRESSOR) 25 MG tablet, Take 25 mg by  mouth 2 (two) times daily., Disp: , Rfl:  .  Multiple Vitamin (MULTIVITAMIN) capsule, Take 1 capsule by mouth daily., Disp: , Rfl:  .  Omega-3 Fatty Acids (FISH OIL) 1000 MG CAPS, Take by mouth daily., Disp: , Rfl:  .  tiotropium (SPIRIVA) 18 MCG inhalation capsule, Place 18 mcg into inhaler and inhale daily., Disp: , Rfl:  .  traMADol (ULTRAM) 50 MG tablet, Take 50 mg by mouth every 6 (six) hours as needed., Disp: , Rfl:  .  venlafaxine XR (EFFEXOR-XR) 150 MG 24 hr capsule, Take 150 mg by mouth  daily with breakfast., Disp: , Rfl:  .  Fluocinolone Acetonide Scalp 0.01 % OIL, USE 1 ML TWICE A DAY APPLY TO AFFECTED AREA ON SCALP, EARS AVOID FACE/GROIN/AXILLA, Disp: , Rfl: 2  Physical exam:  Vitals:   11/03/17 1158  BP: 134/85  Pulse: 73  Resp: 18  Temp: 98.5 F (36.9 C)  TempSrc: Tympanic  SpO2: 97%  Weight: 196 lb 8 oz (89.1 kg)  Height: '5\' 6"'$  (1.676 m)   Physical Exam  Constitutional: She is oriented to person, place, and time and well-developed, well-nourished, and in no distress.  HENT:  Head: Normocephalic and atraumatic.  Eyes: Pupils are equal, round, and reactive to light. EOM are normal.  Neck: Normal range of motion.  Cardiovascular: Normal rate, regular rhythm and normal heart sounds.  Pulmonary/Chest: Effort normal and breath sounds normal.  Abdominal: Soft. Bowel sounds are normal.  Neurological: She is alert and oriented to person, place, and time.  Skin: Skin is warm and dry.   Breast exam was performed in seated and lying down position. Patient is status post right lumpectomy with a well-healed surgical scar. No evidence of any palpable masses. No evidence of axillary adenopathy. No evidence of any palpable masses or lumps in the left breast. No evidence of leftt axillary adenopathy   CMP Latest Ref Rng & Units 11/03/2017  Glucose 65 - 99 mg/dL 139(H)  BUN 6 - 20 mg/dL 19  Creatinine 0.44 - 1.00 mg/dL 0.79  Sodium 135 - 145 mmol/L 137  Potassium 3.5 - 5.1 mmol/L  3.9  Chloride 101 - 111 mmol/L 102  CO2 22 - 32 mmol/L 26  Calcium 8.9 - 10.3 mg/dL 9.0  Total Protein 6.5 - 8.1 g/dL 7.2  Total Bilirubin 0.3 - 1.2 mg/dL 0.4  Alkaline Phos 38 - 126 U/L 77  AST 15 - 41 U/L 25  ALT 14 - 54 U/L 26   CBC Latest Ref Rng & Units 11/03/2017  WBC 3.6 - 11.0 K/uL 6.4  Hemoglobin 12.0 - 16.0 g/dL 13.4  Hematocrit 35.0 - 47.0 % 40.4  Platelets 150 - 440 K/uL 239      Assessment and plan- Patient is a 72 y.o. female stage I ER/PR HER-2/neu negative right breast cancer currently on femara  Recent mammogram from nov 2018 did not reveal any evidence of malignancy. Her low back pain is likely musculoskeletal but given h/o breast cancer, I will obtain a bone scan at this time to r/o malignancy.  I will see her back in 4 months but see her sooner depending on the results of bone scan which I will discuss with her over the phone  Bone density scan from nov 2018 was overall stable as compared to 2016   Visit Diagnosis 1. Carcinoma of upper-outer quadrant of right breast in female, estrogen receptor positive ([place])   2. Acute midline low back pain without sciatica      Dr. Randa Evens, MD, MPH [facility] at [facility] Pager- [phone] 11/04/2017 2:40 PM

## 2017-11-10 ENCOUNTER — Encounter
Admission: RE | Admit: 2017-11-10 | Discharge: 2017-11-10 | Disposition: A | Discharge status: Home / Self Care | Payer: Medicare HMO | Source: Ambulatory Visit | Attending: Oncology | Admitting: Oncology

## 2017-11-10 DIAGNOSIS — M545 Low back pain, unspecified: Secondary | ICD-10-CM

## 2017-11-10 DIAGNOSIS — Z170 Estrogen receptor positive status [ER+]: Secondary | ICD-10-CM | POA: Diagnosis not present

## 2017-11-10 DIAGNOSIS — N644 Mastodynia: Secondary | ICD-10-CM | POA: Diagnosis not present

## 2017-11-10 DIAGNOSIS — C50411 Malignant neoplasm of upper-outer quadrant of right female breast: Secondary | ICD-10-CM

## 2017-11-10 MED ORDER — TECHNETIUM TC 99M MEDRONATE IV KIT
25.0000 | PACK | Freq: Once | INTRAVENOUS | Status: AC | PRN
  Administered 2017-11-10: 24.85 via INTRAVENOUS

## 2017-11-14 ENCOUNTER — Telehealth: Payer: Self-pay | Admitting: *Deleted

## 2017-11-14 DIAGNOSIS — M545 Low back pain, unspecified: Secondary | ICD-10-CM

## 2017-11-14 DIAGNOSIS — Z170 Estrogen receptor positive status [ER+]: Principal | ICD-10-CM

## 2017-11-14 DIAGNOSIS — R937 Abnormal findings on diagnostic imaging of other parts of musculoskeletal system: Secondary | ICD-10-CM

## 2017-11-14 DIAGNOSIS — C50411 Malignant neoplasm of upper-outer quadrant of right female breast: Secondary | ICD-10-CM

## 2017-11-14 DIAGNOSIS — R948 Abnormal results of function studies of other organs and systems: Secondary | ICD-10-CM

## 2017-11-14 NOTE — Telephone Encounter (Signed)
Called pt and let her know that there was some changes seen on whole body scan that is probably arthritis but we need to do MRI to see if it is poss. Metastatic disease.  She is agreeable to a scan next week. She is going on trips in the camper but she is free next week. I told her that I tried calling her but did not get her. She has been camping and not always cell service. I will call her back when I get the appt scheduled.

## 2017-11-14 NOTE — Telephone Encounter (Signed)
Patient called asking to be called with results of Bone Scan at [phone]  IMPRESSION: Areas of increased uptake in the mid lower cervical spine and lower thoracic spine. These areas may have arthropathic etiology, although the changes in the lower thoracic spine could represent foci of metastatic disease. Particularly given symptoms related to this area, it would be reasonable to correlate with MR of the thoracic spine to further assess for potential metastatic lesions in this area. Subtle increased uptake on the right at L5 is likely of arthropathic etiology, although a subtle metastasis in this area is possible.   Electronically Signed   By: Lowella Grip III M.D.   On: 11/10/2017 15:27

## 2017-11-14 NOTE — Telephone Encounter (Signed)
-----   Message from Sindy Guadeloupe, MD sent at 11/10/2017  3:35 PM EDT ----- Bone scan shows changes probably due to arthritis but possible metastatic disease though less likely. Let us proceed with MRI thoracic and lumbo sacral spine with and without contrast and I will see her thereafter

## 2017-11-15 ENCOUNTER — Telehealth: Payer: Self-pay | Admitting: *Deleted

## 2017-11-15 NOTE — Telephone Encounter (Signed)
Called pt on cell phone and no answer, and then I called her home phone and left message that her MRI appt was 4/8 [facility]. Then see md on 4/11 at 3 pm/. I did ask that she call me back to make sure she got my message and if she has questions about the appts.

## 2017-11-17 ENCOUNTER — Telehealth: Payer: Self-pay | Admitting: *Deleted

## 2017-11-17 MED ORDER — LORAZEPAM 0.5 MG PO TABS: 0.5000 mg | ORAL_TABLET | Freq: Once | ORAL | 0 refills | Status: AC

## 2017-11-17 NOTE — Telephone Encounter (Signed)
[place] called and states MRI recommends she has something to relax her before her scan. Please advise

## 2017-11-17 NOTE — Telephone Encounter (Signed)
Called pt and said that dr Janese Banks is ok with ativan 0.5 mg tablet 1 time for scan. She wants to have it to [place] aware that it will be there for her to get it

## 2017-11-21 ENCOUNTER — Ambulatory Visit
Admission: RE | Admit: 2017-11-21 | Discharge: 2017-11-21 | Disposition: A | Discharge status: Home / Self Care | Payer: Medicare HMO | Source: Ambulatory Visit | Attending: Oncology | Admitting: Oncology

## 2017-11-21 DIAGNOSIS — C50411 Malignant neoplasm of upper-outer quadrant of right female breast: Secondary | ICD-10-CM | POA: Diagnosis not present

## 2017-11-21 DIAGNOSIS — M545 Low back pain, unspecified: Secondary | ICD-10-CM

## 2017-11-21 DIAGNOSIS — M47815 Spondylosis without myelopathy or radiculopathy, thoracolumbar region: Secondary | ICD-10-CM | POA: Diagnosis not present

## 2017-11-21 DIAGNOSIS — R948 Abnormal results of function studies of other organs and systems: Secondary | ICD-10-CM | POA: Insufficient documentation

## 2017-11-21 DIAGNOSIS — Z170 Estrogen receptor positive status [ER+]: Secondary | ICD-10-CM | POA: Insufficient documentation

## 2017-11-21 DIAGNOSIS — M47816 Spondylosis without myelopathy or radiculopathy, lumbar region: Secondary | ICD-10-CM | POA: Diagnosis not present

## 2017-11-21 DIAGNOSIS — M5134 Other intervertebral disc degeneration, thoracic region: Secondary | ICD-10-CM | POA: Diagnosis not present

## 2017-11-21 DIAGNOSIS — R937 Abnormal findings on diagnostic imaging of other parts of musculoskeletal system: Secondary | ICD-10-CM

## 2017-11-21 DIAGNOSIS — M47814 Spondylosis without myelopathy or radiculopathy, thoracic region: Secondary | ICD-10-CM | POA: Diagnosis not present

## 2017-11-21 MED ORDER — GADOBENATE DIMEGLUMINE 529 MG/ML IV SOLN
18.0000 mL | Freq: Once | INTRAVENOUS | Status: AC | PRN
  Administered 2017-11-21: 18 mL via INTRAVENOUS

## 2017-11-22 ENCOUNTER — Telehealth: Payer: Self-pay | Admitting: *Deleted

## 2017-11-22 NOTE — Telephone Encounter (Signed)
She is speaking with her now.

## 2017-11-22 NOTE — Telephone Encounter (Signed)
Patient requesting that Judeen Hammans return her call

## 2017-11-23 NOTE — Telephone Encounter (Signed)
I called pt and let her know that the mri showed no cancer but did show bulging disc and degenerative changes. She is going to speak with ortho Earnestine Leys to see if there is something he can do for her pain at neck area. We are cancelling her appt for this week since results were given over the phone. She already has an appt scheduled later for this year.

## 2017-11-24 ENCOUNTER — Telehealth: Payer: Self-pay | Admitting: *Deleted

## 2017-11-24 ENCOUNTER — Inpatient Hospital Stay: Payer: Medicare HMO | Admitting: Oncology

## 2017-11-24 DIAGNOSIS — M542 Cervicalgia: Secondary | ICD-10-CM

## 2017-11-24 NOTE — Telephone Encounter (Signed)
Called pt back and she wants to know what she should do for the pain in her neck. I told her on the phone when I called her that she should check with Dr. Sabra Heck who she has seen in past.  She said that she called Dr. Sabra Heck and they can't see the images so a disc will need to be sent to them and referral to them. I have called radiology and asked for films and when I get them  I will call the md office and fedex films to the office. Pt agreeable to this

## 2017-11-25 ENCOUNTER — Telehealth: Payer: Self-pay | Admitting: *Deleted

## 2017-11-25 NOTE — Telephone Encounter (Signed)
Called pt and let her know that I got the ct of scans and I will take them to [facility] today. She has been given an appt with howard miller 4/24 at 2;30 and he has left for the day but when he reviews the films if he feels your appt needs to be moved up and he has schedule he can move it up and let pt know. She is agreeable to the plan

## 2017-12-07 DIAGNOSIS — M5134 Other intervertebral disc degeneration, thoracic region: Secondary | ICD-10-CM | POA: Insufficient documentation

## 2018-01-23 DIAGNOSIS — Z0100 Encounter for examination of eyes and vision without abnormal findings: Secondary | ICD-10-CM | POA: Diagnosis not present

## 2018-01-23 DIAGNOSIS — H524 Presbyopia: Secondary | ICD-10-CM | POA: Diagnosis not present

## 2018-01-31 DIAGNOSIS — I700 Atherosclerosis of aorta: Secondary | ICD-10-CM | POA: Diagnosis not present

## 2018-01-31 DIAGNOSIS — E119 Type 2 diabetes mellitus without complications: Secondary | ICD-10-CM | POA: Diagnosis not present

## 2018-01-31 DIAGNOSIS — Z0000 Encounter for general adult medical examination without abnormal findings: Secondary | ICD-10-CM | POA: Diagnosis not present

## 2018-01-31 DIAGNOSIS — I10 Essential (primary) hypertension: Secondary | ICD-10-CM | POA: Diagnosis not present

## 2018-01-31 DIAGNOSIS — M150 Primary generalized (osteo)arthritis: Secondary | ICD-10-CM | POA: Diagnosis not present

## 2018-01-31 DIAGNOSIS — J449 Chronic obstructive pulmonary disease, unspecified: Secondary | ICD-10-CM | POA: Diagnosis not present

## 2018-01-31 DIAGNOSIS — E538 Deficiency of other specified B group vitamins: Secondary | ICD-10-CM | POA: Diagnosis not present

## 2018-01-31 DIAGNOSIS — G500 Trigeminal neuralgia: Secondary | ICD-10-CM | POA: Diagnosis not present

## 2018-01-31 DIAGNOSIS — E7849 Other hyperlipidemia: Secondary | ICD-10-CM | POA: Diagnosis not present

## 2018-01-31 DIAGNOSIS — Z23 Encounter for immunization: Secondary | ICD-10-CM | POA: Diagnosis not present

## 2018-02-14 DIAGNOSIS — R0609 Other forms of dyspnea: Secondary | ICD-10-CM | POA: Diagnosis not present

## 2018-02-17 DIAGNOSIS — J449 Chronic obstructive pulmonary disease, unspecified: Secondary | ICD-10-CM | POA: Diagnosis not present

## 2018-02-21 DIAGNOSIS — J449 Chronic obstructive pulmonary disease, unspecified: Secondary | ICD-10-CM | POA: Diagnosis not present

## 2018-02-21 DIAGNOSIS — E119 Type 2 diabetes mellitus without complications: Secondary | ICD-10-CM | POA: Diagnosis not present

## 2018-02-21 DIAGNOSIS — I700 Atherosclerosis of aorta: Secondary | ICD-10-CM | POA: Diagnosis not present

## 2018-02-21 DIAGNOSIS — E7849 Other hyperlipidemia: Secondary | ICD-10-CM | POA: Diagnosis not present

## 2018-03-07 ENCOUNTER — Inpatient Hospital Stay: Payer: Medicare HMO | Admitting: Oncology

## 2018-03-16 ENCOUNTER — Encounter: Payer: Self-pay | Admitting: Oncology

## 2018-03-16 ENCOUNTER — Inpatient Hospital Stay: Payer: Medicare HMO | Attending: Oncology | Admitting: Oncology

## 2018-03-16 VITALS — BP 132/82 | HR 68 | Temp 97.5°F | Resp 18 | Ht 66.0 in | Wt 197.7 lb

## 2018-03-16 DIAGNOSIS — Z79899 Other long term (current) drug therapy: Secondary | ICD-10-CM | POA: Insufficient documentation

## 2018-03-16 DIAGNOSIS — I10 Essential (primary) hypertension: Secondary | ICD-10-CM | POA: Diagnosis not present

## 2018-03-16 DIAGNOSIS — Z9981 Dependence on supplemental oxygen: Secondary | ICD-10-CM | POA: Diagnosis not present

## 2018-03-16 DIAGNOSIS — J449 Chronic obstructive pulmonary disease, unspecified: Secondary | ICD-10-CM

## 2018-03-16 DIAGNOSIS — Z170 Estrogen receptor positive status [ER+]: Secondary | ICD-10-CM | POA: Diagnosis not present

## 2018-03-16 DIAGNOSIS — Z87891 Personal history of nicotine dependence: Secondary | ICD-10-CM

## 2018-03-16 DIAGNOSIS — Z79811 Long term (current) use of aromatase inhibitors: Secondary | ICD-10-CM | POA: Diagnosis not present

## 2018-03-16 DIAGNOSIS — M8580 Other specified disorders of bone density and structure, unspecified site: Secondary | ICD-10-CM | POA: Insufficient documentation

## 2018-03-16 DIAGNOSIS — C50411 Malignant neoplasm of upper-outer quadrant of right female breast: Secondary | ICD-10-CM | POA: Diagnosis not present

## 2018-03-16 DIAGNOSIS — Z853 Personal history of malignant neoplasm of breast: Secondary | ICD-10-CM

## 2018-03-16 DIAGNOSIS — Z08 Encounter for follow-up examination after completed treatment for malignant neoplasm: Secondary | ICD-10-CM

## 2018-03-16 NOTE — Progress Notes (Signed)
Hematology/Oncology Consult note [facility]  Telephone:([phone] [phone]  Patient Care Team: Adin Hector, MD as PCP - General (Internal Medicine) Adin Hector, MD (Internal Medicine) Bary Castilla [place] Gleason, MD (General Surgery)   Name of the patient: Sandra Burnett  [id number]  Dec 27, 1945   Date of visit: 03/16/18  Diagnosis- stage I ER/PR HER-2/neu negative right breast cancer  Chief complaint/ Reason for visit- routine f/u of breast cancer  Heme/Onc history:  Oncology History   2015- stage I (T1b N0 M0) ER/PR positive HER-2/neu-NEG invasive mammary carcinoma status post wide local excision; SLNBx [Dr.Byrnett] status post mammosite- November of 2015; Started on Femara in December of 2015.  # SEP 2016- BMD- OSTEOPENIA; mammo-NEG       Interval history-she continues to take letrozole along with calcium and vitamin D daily which she is tolerating relatively well.  She does have chronic back pain and also reports occasional pain in her small joints of her hands.  She had worsening shortness of breath a couple of months ago for which she had cardiology and pulmonary evaluation.  She is currently on home oxygen 24 hours at 2 L  ECOG PS- 1 Pain scale- 5   Review of systems- Review of Systems  Constitutional: Positive for malaise/fatigue. Negative for chills, fever and weight loss.  HENT: Negative for congestion, ear discharge and nosebleeds.   Eyes: Negative for blurred vision.  Respiratory: Positive for shortness of breath. Negative for cough, hemoptysis, sputum production and wheezing.   Cardiovascular: Negative for chest pain, palpitations, orthopnea and claudication.  Gastrointestinal: Negative for abdominal pain, blood in stool, constipation, diarrhea, heartburn, melena, nausea and vomiting.  Genitourinary: Negative for dysuria, flank pain, frequency, hematuria and urgency.  Musculoskeletal: Positive for back pain and joint  pain. Negative for myalgias.  Skin: Negative for rash.  Neurological: Negative for dizziness, tingling, focal weakness, seizures, weakness and headaches.  Endo/Heme/Allergies: Does not bruise/bleed easily.  Psychiatric/Behavioral: Negative for depression and suicidal ideas. The patient does not have insomnia.       Allergies  Allergen Reactions  . Lisinopril Other (See Comments)    Severe hypotension  . Other Anaphylaxis and Other (See Comments)    Yellow dye Elevated LFTs "Yellow Dye"  . Codeine Nausea And Vomiting and Nausea Only    Other reaction(s): Vomiting     Past Medical History:  Diagnosis Date  . Asthma   . Breast cancer ([place]) right   2015- mammosite   . COPD (chronic obstructive pulmonary disease) ([place])   . Diabetes mellitus without complication ([place])   . Hyperlipidemia   . Hypertension   . Osteopenia   . Osteoporosis   . Psoriasis      Past Surgical History:  Procedure Laterality Date  . ABDOMINAL HYSTERECTOMY  1970's   partial  . APPENDECTOMY    . BREAST BIOPSY Bilateral 1980's  . BREAST BIOPSY Right 2015   positive  . BREAST SURGERY Right 06/13/2014   8 mm ER+, PR+, Her 2 not over expressed, upper outer quadrant. Mammosite radiation.  . carpel tunnel Bilateral 1984  . CHOLECYSTECTOMY  1984  . COLONOSCOPY  2008   Dr Kristopher Glee  . TUBAL LIGATION      Social History   Socioeconomic History  . Marital status: Married    Spouse name: Not on file  . Number of children: Not on file  . Years of education: Not on file  . Highest education level: Not on file  Occupational History  . Not on file  Social Needs  . Financial resource strain: Not on file  . Food insecurity:    Worry: Not on file    Inability: Not on file  . Transportation needs:    Medical: Not on file    Non-medical: Not on file  Tobacco Use  . Smoking status: Former Smoker    Packs/day: 1.00    Years: 40.00    Pack years: 40.00  . Smokeless tobacco: Never Used  Substance and  Sexual Activity  . Alcohol use: No  . Drug use: No  . Sexual activity: Not on file  Lifestyle  . Physical activity:    Days per week: Not on file    Minutes per session: Not on file  . Stress: Not on file  Relationships  . Social connections:    Talks on phone: Not on file    Gets together: Not on file    Attends religious service: Not on file    Active member of club or organization: Not on file    Attends meetings of clubs or organizations: Not on file    Relationship status: Not on file  . Intimate partner violence:    Fear of current or ex partner: Not on file    Emotionally abused: Not on file    Physically abused: Not on file    Forced sexual activity: Not on file  Other Topics Concern  . Not on file  Social History Narrative  . Not on file    Family History  Problem Relation Age of Onset  . Pancreatitis Father   . Cancer Paternal Grandmother        breast  . Breast cancer Paternal Grandmother   . Breast cancer Paternal Aunt      Current Outpatient Medications:  .  albuterol (VENTOLIN HFA) 108 (90 Base) MCG/ACT inhaler, , Disp: , Rfl:  .  Calcium Carbonate-Vit D-Min (CALTRATE 600+D PLUS PO), Take by mouth daily. , Disp: , Rfl:  .  EPINEPHrine 0.3 mg/0.3 mL IJ SOAJ injection, Inject 0.3 mg into the muscle once., Disp: , Rfl:  .  Fluticasone-Salmeterol (ADVAIR) 100-50 MCG/DOSE AEPB, INHALE ONE PUFF 2 TIMES A DAY AS DIRECTED, Disp: , Rfl:  .  hydrochlorothiazide (HYDRODIURIL) 25 MG tablet, Take 25 mg by mouth daily., Disp: , Rfl:  .  letrozole (FEMARA) 2.5 MG tablet, TAKE 1 TABLET EVERY DAY, Disp: 90 tablet, Rfl: 3 .  lovastatin (MEVACOR) 40 MG tablet, Take 40 mg by mouth at bedtime., Disp: , Rfl:  .  meloxicam (MOBIC) 15 MG tablet, , Disp: , Rfl:  .  metFORMIN (GLUCOPHAGE) 500 MG tablet, TAKE ONE TABLET BY MOUTH 2 TIMES A DAY, Disp: , Rfl:  .  metoprolol tartrate (LOPRESSOR) 25 MG tablet, Take 25 mg by mouth 2 (two) times daily., Disp: , Rfl:  .  Multiple Vitamin  (MULTIVITAMIN) capsule, Take 1 capsule by mouth daily., Disp: , Rfl:  .  Omega-3 Fatty Acids (FISH OIL) 1000 MG CAPS, Take by mouth daily., Disp: , Rfl:  .  tiotropium (SPIRIVA) 18 MCG inhalation capsule, Place 18 mcg into inhaler and inhale daily., Disp: , Rfl:  .  venlafaxine XR (EFFEXOR-XR) 150 MG 24 hr capsule, Take 150 mg by mouth daily with breakfast., Disp: , Rfl:  .  aspirin EC 81 MG tablet, Take by mouth., Disp: , Rfl:  .  clobetasol (TEMOVATE) 0.05 % external solution, USE 1 ML TWICE A DAY AS NEEDED. APPLY TO  AFFECTED AREA ON SCALP. AVOID FACE/GROIN/AXILLA, Disp: , Rfl: 2 .  Fluocinolone Acetonide Scalp 0.01 % OIL, USE 1 ML TWICE A DAY APPLY TO AFFECTED AREA ON SCALP, EARS AVOID FACE/GROIN/AXILLA, Disp: , Rfl: 2 .  traMADol (ULTRAM) 50 MG tablet, Take 50 mg by mouth every 6 (six) hours as needed., Disp: , Rfl:   Physical exam:  Vitals:   03/16/18 1403  BP: 132/82  Pulse: 68  Resp: 18  Temp: (!) 97.5 F (36.4 C)  TempSrc: Tympanic  SpO2: 96%  Weight: 197 lb 11.2 oz (89.7 kg)  Height: _0  (1.676 m)   Physical Exam  Constitutional: She is oriented to person, place, and time. She appears well-developed and well-nourished.  She is on portable O2  HENT:  Head: Normocephalic and atraumatic.  Eyes: Pupils are equal, round, and reactive to light. EOM are normal.  Neck: Normal range of motion.  Cardiovascular: Normal rate, regular rhythm and normal heart sounds.  Pulmonary/Chest: Effort normal and breath sounds normal.  Abdominal: Soft. Bowel sounds are normal.  Neurological: She is alert and oriented to person, place, and time.  Skin: Skin is warm and dry.   Breast exam was performed in seated and lying down position. Patient is status post right lumpectomy with a well-healed surgical scar. No evidence of any palpable masses. No evidence of axillary adenopathy. No evidence of any palpable masses or lumps in the left breast. No evidence of leftt axillary adenopathy    CMP  Latest Ref Rng & Units 11/03/2017  Glucose 65 - 99 mg/dL 139(H)  BUN 6 - 20 mg/dL 19  Creatinine 0.44 - 1.00 mg/dL 0.79  Sodium 135 - 145 mmol/L 137  Potassium 3.5 - 5.1 mmol/L 3.9  Chloride 101 - 111 mmol/L 102  CO2 22 - 32 mmol/L 26  Calcium 8.9 - 10.3 mg/dL 9.0  Total Protein 6.5 - 8.1 g/dL 7.2  Total Bilirubin 0.3 - 1.2 mg/dL 0.4  Alkaline Phos 38 - 126 U/L 77  AST 15 - 41 U/L 25  ALT 14 - 54 U/L 26   CBC Latest Ref Rng & Units 11/03/2017  WBC 3.6 - 11.0 K/uL 6.4  Hemoglobin 12.0 - 16.0 g/dL 13.4  Hematocrit 35.0 - 47.0 % 40.4  Platelets 150 - 440 K/uL 239      Assessment and plan- Patient is a 72 y.o. female stage I ER/PR HER-2/neu negative right breast cancercurrently on femara. She is here for routine f/u of breast cancer  Clinically patient is doing well and there is no evidence of malignancy on today's exam.  She is due for a mammogram in June 24, 2018 which will be coordinated by Dr. Bary Castilla.  She does have chronic back pain which has been evaluated with a bone scan and MRI 4 months ago which did not reveal any evidence of metastatic disease.  She has seen orthopedics for this and she is hesitant to go through any steroid shots.  Further management of back pain per Dr. Caryl Comes as it is unrelated to her breast cancer.  I will see her back in 6 months no labs.  She will continue to take letrozole along with calcium and vitamin D and she completes this 5 years next year after which she will stop    Visit Diagnosis 1. Encounter for follow-up surveillance of breast cancer   2. Use of letrozole (Femara)      Dr. Randa Evens, MD, MPH [facility] [phone] 03/16/2018 3:54 PM

## 2018-03-24 DIAGNOSIS — E7849 Other hyperlipidemia: Secondary | ICD-10-CM | POA: Diagnosis not present

## 2018-03-24 DIAGNOSIS — I700 Atherosclerosis of aorta: Secondary | ICD-10-CM | POA: Diagnosis not present

## 2018-03-24 DIAGNOSIS — J449 Chronic obstructive pulmonary disease, unspecified: Secondary | ICD-10-CM | POA: Diagnosis not present

## 2018-03-24 DIAGNOSIS — E119 Type 2 diabetes mellitus without complications: Secondary | ICD-10-CM | POA: Diagnosis not present

## 2018-04-24 DIAGNOSIS — I700 Atherosclerosis of aorta: Secondary | ICD-10-CM | POA: Diagnosis not present

## 2018-04-24 DIAGNOSIS — J449 Chronic obstructive pulmonary disease, unspecified: Secondary | ICD-10-CM | POA: Diagnosis not present

## 2018-04-24 DIAGNOSIS — E7849 Other hyperlipidemia: Secondary | ICD-10-CM | POA: Diagnosis not present

## 2018-04-24 DIAGNOSIS — E119 Type 2 diabetes mellitus without complications: Secondary | ICD-10-CM | POA: Diagnosis not present

## 2018-05-12 ENCOUNTER — Other Ambulatory Visit: Payer: Self-pay

## 2018-05-12 DIAGNOSIS — Z170 Estrogen receptor positive status [ER+]: Principal | ICD-10-CM

## 2018-05-12 DIAGNOSIS — C50411 Malignant neoplasm of upper-outer quadrant of right female breast: Secondary | ICD-10-CM

## 2018-05-12 NOTE — Progress Notes (Signed)
dia

## 2018-05-24 DIAGNOSIS — E7849 Other hyperlipidemia: Secondary | ICD-10-CM | POA: Diagnosis not present

## 2018-05-24 DIAGNOSIS — I700 Atherosclerosis of aorta: Secondary | ICD-10-CM | POA: Diagnosis not present

## 2018-05-24 DIAGNOSIS — E119 Type 2 diabetes mellitus without complications: Secondary | ICD-10-CM | POA: Diagnosis not present

## 2018-05-24 DIAGNOSIS — J449 Chronic obstructive pulmonary disease, unspecified: Secondary | ICD-10-CM | POA: Diagnosis not present

## 2018-06-19 ENCOUNTER — Ambulatory Visit
Admission: RE | Admit: 2018-06-19 | Discharge: 2018-06-19 | Disposition: A | Discharge status: Home / Self Care | Payer: Medicare HMO | Source: Ambulatory Visit | Attending: General Surgery | Admitting: General Surgery

## 2018-06-19 DIAGNOSIS — Z170 Estrogen receptor positive status [ER+]: Secondary | ICD-10-CM | POA: Insufficient documentation

## 2018-06-19 DIAGNOSIS — C50411 Malignant neoplasm of upper-outer quadrant of right female breast: Secondary | ICD-10-CM

## 2018-06-19 DIAGNOSIS — Z853 Personal history of malignant neoplasm of breast: Secondary | ICD-10-CM | POA: Diagnosis not present

## 2018-06-19 DIAGNOSIS — R928 Other abnormal and inconclusive findings on diagnostic imaging of breast: Secondary | ICD-10-CM | POA: Diagnosis not present

## 2018-06-19 HISTORY — DX: Personal history of irradiation: Z92.3

## 2018-06-24 DIAGNOSIS — J449 Chronic obstructive pulmonary disease, unspecified: Secondary | ICD-10-CM | POA: Diagnosis not present

## 2018-06-24 DIAGNOSIS — E119 Type 2 diabetes mellitus without complications: Secondary | ICD-10-CM | POA: Diagnosis not present

## 2018-06-24 DIAGNOSIS — I700 Atherosclerosis of aorta: Secondary | ICD-10-CM | POA: Diagnosis not present

## 2018-06-24 DIAGNOSIS — E7849 Other hyperlipidemia: Secondary | ICD-10-CM | POA: Diagnosis not present

## 2018-06-27 ENCOUNTER — Ambulatory Visit: Payer: Medicare HMO | Admitting: General Surgery

## 2018-07-11 ENCOUNTER — Other Ambulatory Visit: Payer: Self-pay

## 2018-07-11 ENCOUNTER — Ambulatory Visit: Payer: Medicare HMO | Admitting: General Surgery

## 2018-07-11 ENCOUNTER — Encounter: Payer: Self-pay | Admitting: General Surgery

## 2018-07-11 VITALS — BP 180/81 | HR 80 | Resp 18 | Ht 66.0 in | Wt 194.0 lb

## 2018-07-11 DIAGNOSIS — Z170 Estrogen receptor positive status [ER+]: Secondary | ICD-10-CM

## 2018-07-11 DIAGNOSIS — C50411 Malignant neoplasm of upper-outer quadrant of right female breast: Secondary | ICD-10-CM | POA: Diagnosis not present

## 2018-07-11 NOTE — Progress Notes (Signed)
Patient ID: Sandra Burnett, female   DOB: 09-06-1945, 72 y.o.   MRN: [id number]  Chief Complaint  Patient presents with  . Follow-up    HPI Sandra Burnett is a 72 y.o. female who presents for her follow up right breast cancer and a breast evaluation. The most recent mammogram was done on 06/19/2018 .  Patient does perform regular self breast checks and gets regular mammograms done.  No new breast issues. Tolerating letrozole, admits to hands and feet feel "strange", "numb", but this is unchanged. She has been wearing oxygen since the summer.  Saturations were as low as 84% prior to institution of 2 L nasal cannula.  Improved exercise tolerance with O2 therapy.  HPI  Past Medical History:  Diagnosis Date  . Asthma   . Breast cancer ([place]) right   2015- mammosite   . COPD (chronic obstructive pulmonary disease) ([place])   . Diabetes mellitus without complication ([place])   . Hyperlipidemia   . Hypertension   . Osteopenia   . Osteoporosis   . Personal history of radiation therapy 2015   RIGHT lumpectomy -Mammosite  . Psoriasis   . Screen for colon cancer 08/02/2017   negative cologuard    Past Surgical History:  Procedure Laterality Date  . ABDOMINAL HYSTERECTOMY  1970's   partial  . APPENDECTOMY    . BREAST BIOPSY Bilateral 1980's  . BREAST BIOPSY Right 2015    INVASIVE MAMMARY CARCINOMA  . BREAST LUMPECTOMY Right 2015    INVASIVE MAMMARY CARCINOMA  . BREAST SURGERY Right 06/13/2014   8 mm ER+, PR+, Her 2 not over expressed, upper outer quadrant. Mammosite radiation.  . carpel tunnel Bilateral 1984  . CHOLECYSTECTOMY  1984  . COLONOSCOPY  2008   Dr Kristopher Glee  . TUBAL LIGATION      Family History  Problem Relation Age of Onset  . Pancreatitis Father   . Cancer Paternal Grandmother        breast  . Breast cancer Paternal Grandmother   . Breast cancer Paternal Aunt     Social History Social History   Tobacco Use  . Smoking status: Former Smoker    Packs/day: 1.00   Years: 40.00    Pack years: 40.00  . Smokeless tobacco: Never Used  Substance Use Topics  . Alcohol use: No  . Drug use: No    Allergies  Allergen Reactions  . Lisinopril Other (See Comments)    Severe hypotension  . Other Anaphylaxis and Other (See Comments)    Yellow dye Elevated LFTs "Yellow Dye"  . Codeine Nausea And Vomiting and Nausea Only    Other reaction(s): Vomiting    Current Outpatient Medications  Medication Sig Dispense Refill  . albuterol (VENTOLIN HFA) 108 (90 Base) MCG/ACT inhaler     . aspirin EC 81 MG tablet Take by mouth.    . Calcium Carbonate-Vit D-Min (CALTRATE 600+D PLUS PO) Take by mouth daily.     . clobetasol (TEMOVATE) 0.05 % external solution USE 1 ML TWICE A DAY AS NEEDED. APPLY TO AFFECTED AREA ON SCALP. AVOID FACE/GROIN/AXILLA  2  . EPINEPHrine 0.3 mg/0.3 mL IJ SOAJ injection Inject 0.3 mg into the muscle once.    . Fluocinolone Acetonide Scalp 0.01 % OIL USE 1 ML TWICE A DAY APPLY TO AFFECTED AREA ON SCALP, EARS AVOID FACE/GROIN/AXILLA  2  . Fluticasone-Salmeterol (ADVAIR) 100-50 MCG/DOSE AEPB INHALE ONE PUFF 2 TIMES A DAY AS DIRECTED    . hydrochlorothiazide (HYDRODIURIL) 25 MG tablet  Take 25 mg by mouth daily.    Marland Kitchen letrozole (FEMARA) 2.5 MG tablet TAKE 1 TABLET EVERY DAY 90 tablet 3  . lovastatin (MEVACOR) 40 MG tablet Take 40 mg by mouth at bedtime.    . meloxicam (MOBIC) 15 MG tablet     . metFORMIN (GLUCOPHAGE) 500 MG tablet TAKE ONE TABLET BY MOUTH 2 TIMES A DAY    . metoprolol tartrate (LOPRESSOR) 25 MG tablet Take 25 mg by mouth 2 (two) times daily.    . Multiple Vitamin (MULTIVITAMIN) capsule Take 1 capsule by mouth daily.    . Omega-3 Fatty Acids (FISH OIL) 1000 MG CAPS Take by mouth daily.    . OXYGEN Inhale 2 L into the lungs.    . tiotropium (SPIRIVA) 18 MCG inhalation capsule Place 18 mcg into inhaler and inhale daily.    Marland Kitchen venlafaxine XR (EFFEXOR-XR) 150 MG 24 hr capsule Take 150 mg by mouth daily with breakfast.     No current  facility-administered medications for this visit.     Review of Systems Review of Systems  Constitutional: Negative.   Respiratory: Negative.   Cardiovascular: Negative.     Blood pressure (!) 180/81, pulse 80, resp. rate 18, height 5\' 6"  (1.676 m), weight 194 lb (88 kg), SpO2 95 %.  Physical Exam Physical Exam  Constitutional: She is oriented to person, place, and time. She appears well-developed and well-nourished.  HENT:  Mouth/Throat: Oropharynx is clear and moist.  Eyes: Conjunctivae are normal. No scleral icterus.  Neck: Neck supple.  Cardiovascular: Normal rate, regular rhythm and normal heart sounds.  Pulmonary/Chest: Effort normal and breath sounds normal. Right breast exhibits no inverted nipple, no mass, no nipple discharge, no skin change and no tenderness. Left breast exhibits no inverted nipple, no mass, no nipple discharge, no skin change and no tenderness.  Right lumpectomy incision scab well healed.    Lymphadenopathy:    She has no cervical adenopathy.    She has no axillary adenopathy.  Neurological: She is alert and oriented to person, place, and time.  Skin: Skin is warm and dry.  Psychiatric: Her behavior is normal.    Data Reviewed Bilateral diagnostic mammograms dated June 19, 2018 were reviewed.  Postsurgical changes.  BI-RADS-2.  Bone density dated June 16, 2017 showed progression from normal values to osteopenia, minimal absolute change from -0.9 to -1.1.   Assessment    No evidence of recurrent breast cancer.  Patient continues to tolerate letrozole.    Plan  Cologuard 08-02-17 was negative.   The patient has been asked to return to the office in one year with a bilateral diagnostic mammogram.    HPI, Physical Exam, Assessment and Plan have been scribed under the direction and in the presence of Robert Bellow, MD. Karie Fetch, RN  I have completed the exam and reviewed the above documentation for accuracy and  completeness.  I agree with the above.  Haematologist has been used and any errors in dictation or transcription are unintentional.  Hervey Ard, M.D., F.A.C.S.  Forest Gleason Jakaiya Netherland 07/12/2018, 9:54 AM

## 2018-07-11 NOTE — Patient Instructions (Addendum)
The patient is aware to call back for any questions or new concerns. cologuard 08-02-17 was negative The patient has been asked to return to the office in one year with a bilateral diagnostic mammogram.

## 2018-07-24 DIAGNOSIS — E7849 Other hyperlipidemia: Secondary | ICD-10-CM | POA: Diagnosis not present

## 2018-07-24 DIAGNOSIS — I700 Atherosclerosis of aorta: Secondary | ICD-10-CM | POA: Diagnosis not present

## 2018-07-24 DIAGNOSIS — E119 Type 2 diabetes mellitus without complications: Secondary | ICD-10-CM | POA: Diagnosis not present

## 2018-07-24 DIAGNOSIS — J449 Chronic obstructive pulmonary disease, unspecified: Secondary | ICD-10-CM | POA: Diagnosis not present

## 2018-08-01 DIAGNOSIS — E119 Type 2 diabetes mellitus without complications: Secondary | ICD-10-CM | POA: Diagnosis not present

## 2018-08-01 DIAGNOSIS — C50411 Malignant neoplasm of upper-outer quadrant of right female breast: Secondary | ICD-10-CM | POA: Diagnosis not present

## 2018-08-01 DIAGNOSIS — E7849 Other hyperlipidemia: Secondary | ICD-10-CM | POA: Diagnosis not present

## 2018-08-01 DIAGNOSIS — E538 Deficiency of other specified B group vitamins: Secondary | ICD-10-CM | POA: Diagnosis not present

## 2018-08-01 DIAGNOSIS — I700 Atherosclerosis of aorta: Secondary | ICD-10-CM | POA: Diagnosis not present

## 2018-08-01 DIAGNOSIS — J9611 Chronic respiratory failure with hypoxia: Secondary | ICD-10-CM | POA: Insufficient documentation

## 2018-08-01 DIAGNOSIS — I10 Essential (primary) hypertension: Secondary | ICD-10-CM | POA: Diagnosis not present

## 2018-08-01 DIAGNOSIS — J449 Chronic obstructive pulmonary disease, unspecified: Secondary | ICD-10-CM | POA: Diagnosis not present

## 2018-08-01 DIAGNOSIS — Z23 Encounter for immunization: Secondary | ICD-10-CM | POA: Diagnosis not present

## 2018-08-01 DIAGNOSIS — F325 Major depressive disorder, single episode, in full remission: Secondary | ICD-10-CM | POA: Diagnosis not present

## 2018-08-24 DIAGNOSIS — E7849 Other hyperlipidemia: Secondary | ICD-10-CM | POA: Diagnosis not present

## 2018-08-24 DIAGNOSIS — E119 Type 2 diabetes mellitus without complications: Secondary | ICD-10-CM | POA: Diagnosis not present

## 2018-08-24 DIAGNOSIS — J449 Chronic obstructive pulmonary disease, unspecified: Secondary | ICD-10-CM | POA: Diagnosis not present

## 2018-08-24 DIAGNOSIS — I700 Atherosclerosis of aorta: Secondary | ICD-10-CM | POA: Diagnosis not present

## 2018-08-28 ENCOUNTER — Ambulatory Visit
Admission: RE | Admit: 2018-08-28 | Discharge: 2018-08-28 | Disposition: A | Discharge status: Home / Self Care | Payer: Medicare HMO | Source: Ambulatory Visit | Attending: Radiation Oncology | Admitting: Radiation Oncology

## 2018-08-28 ENCOUNTER — Encounter: Payer: Self-pay | Admitting: Radiation Oncology

## 2018-08-28 ENCOUNTER — Other Ambulatory Visit: Payer: Self-pay

## 2018-08-28 VITALS — BP 171/86 | HR 67 | Temp 97.1°F | Resp 20 | Wt 194.7 lb

## 2018-08-28 DIAGNOSIS — Z79811 Long term (current) use of aromatase inhibitors: Secondary | ICD-10-CM | POA: Insufficient documentation

## 2018-08-28 DIAGNOSIS — C50411 Malignant neoplasm of upper-outer quadrant of right female breast: Secondary | ICD-10-CM | POA: Diagnosis not present

## 2018-08-28 DIAGNOSIS — Z170 Estrogen receptor positive status [ER+]: Secondary | ICD-10-CM | POA: Insufficient documentation

## 2018-08-28 DIAGNOSIS — Z923 Personal history of irradiation: Secondary | ICD-10-CM | POA: Diagnosis not present

## 2018-08-28 NOTE — Progress Notes (Signed)
Radiation Oncology Follow up Note  Name: Sandra Burnett   Date:   08/28/2018 MRN:  [id number] DOB: 06/29/1946    This 73 y.o. female presents to the clinic today for 4-1/2 year follow-up status post accelerated partial breast radiation to her right breast for stage I ER/PR positive invasive mammary carcinoma.Marland Kitchen  REFERRING PROVIDER: Adin Hector, MD  HPI: patient is a 73 year old female now out 4 and half years having completed accelerated partial breast radiation to her right breast for stage I ER/PR positive invasive mammary carcinoma. She is seen today in routine follow-up is doing well. She specifically denies breast tenderness cough or bone pain..her most recent mammograms back in November 2019 were BI-RADS 2 benign which I have reviewed.she's currently on Femara tolerating that well without side effect.  COMPLICATIONS OF TREATMENT: none  FOLLOW UP COMPLIANCE: keeps appointments   PHYSICAL EXAM:  BP (!) 171/86   Pulse 67   Temp (!) 97.1 F (36.2 C) (Tympanic)   Resp 20   Wt 194 lb 10.7 oz (88.3 kg)   BMI 31.42 kg/m  Lungs are clear to A&P cardiac examination essentially unremarkable with regular rate and rhythm. No dominant mass or nodularity is noted in either breast in 2 positions examined. Incision is well-healed. No axillary or supraclavicular adenopathy is appreciated. Cosmetic result is excellent.Well-developed well-nourished patient in NAD. HEENT reveals PERLA, EOMI, discs not visualized.  Oral cavity is clear. No oral mucosal lesions are identified. Neck is clear without evidence of cervical or supraclavicular adenopathy. Lungs are clear to A&P. Cardiac examination is essentially unremarkable with regular rate and rhythm without murmur rub or thrill. Abdomen is benign with no organomegaly or masses noted. Motor sensory and DTR levels are equal and symmetric in the upper and lower extremities. Cranial nerves II through XII are grossly intact. Proprioception is intact. No  peripheral adenopathy or edema is identified. No motor or sensory levels are noted. Crude visual fields are within normal range.  RADIOLOGY RESULTS: mammograms reviewed and compatible above-stated findings  PLAN: present time she is now out close to 5 years with no evidence of disease. I'm turning follow-up care over to surgery and medical oncology. I be happy to reevaluate the patient any time should further consultation be indicated. She continues on Femara without side effect. Patient is to call with any concerns at any time.  I would like to take this opportunity to thank you for allowing me to participate in the care of your patient.Noreene Filbert, MD

## 2018-09-21 ENCOUNTER — Inpatient Hospital Stay: Payer: Self-pay | Admitting: Oncology

## 2018-09-21 ENCOUNTER — Telehealth: Payer: Self-pay | Admitting: *Deleted

## 2018-09-21 NOTE — Telephone Encounter (Signed)
Patient stated that she will call back next week to R/S. her 09/21/18 MD appt.

## 2019-03-06 ENCOUNTER — Encounter: Payer: Self-pay | Admitting: General Surgery

## 2019-04-16 ENCOUNTER — Inpatient Hospital Stay: Payer: Medicare Other | Attending: Oncology | Admitting: Oncology

## 2019-04-16 ENCOUNTER — Other Ambulatory Visit: Payer: Self-pay

## 2019-04-16 ENCOUNTER — Other Ambulatory Visit: Payer: Self-pay | Admitting: *Deleted

## 2019-04-16 ENCOUNTER — Encounter: Payer: Self-pay | Admitting: Oncology

## 2019-04-16 VITALS — BP 152/65 | HR 75 | Temp 98.7°F | Ht 64.0 in | Wt 177.0 lb

## 2019-04-16 DIAGNOSIS — E785 Hyperlipidemia, unspecified: Secondary | ICD-10-CM | POA: Diagnosis not present

## 2019-04-16 DIAGNOSIS — M545 Low back pain: Secondary | ICD-10-CM | POA: Insufficient documentation

## 2019-04-16 DIAGNOSIS — Z7984 Long term (current) use of oral hypoglycemic drugs: Secondary | ICD-10-CM | POA: Insufficient documentation

## 2019-04-16 DIAGNOSIS — Z79811 Long term (current) use of aromatase inhibitors: Secondary | ICD-10-CM | POA: Diagnosis not present

## 2019-04-16 DIAGNOSIS — C50411 Malignant neoplasm of upper-outer quadrant of right female breast: Secondary | ICD-10-CM | POA: Diagnosis not present

## 2019-04-16 DIAGNOSIS — Z170 Estrogen receptor positive status [ER+]: Secondary | ICD-10-CM

## 2019-04-16 DIAGNOSIS — Z7951 Long term (current) use of inhaled steroids: Secondary | ICD-10-CM | POA: Insufficient documentation

## 2019-04-16 DIAGNOSIS — Z791 Long term (current) use of non-steroidal anti-inflammatories (NSAID): Secondary | ICD-10-CM | POA: Diagnosis not present

## 2019-04-16 DIAGNOSIS — Z7982 Long term (current) use of aspirin: Secondary | ICD-10-CM | POA: Insufficient documentation

## 2019-04-16 DIAGNOSIS — Z923 Personal history of irradiation: Secondary | ICD-10-CM | POA: Diagnosis not present

## 2019-04-16 DIAGNOSIS — Z90710 Acquired absence of both cervix and uterus: Secondary | ICD-10-CM | POA: Insufficient documentation

## 2019-04-16 DIAGNOSIS — M8580 Other specified disorders of bone density and structure, unspecified site: Secondary | ICD-10-CM | POA: Diagnosis not present

## 2019-04-16 DIAGNOSIS — J4489 Other specified chronic obstructive pulmonary disease: Secondary | ICD-10-CM | POA: Insufficient documentation

## 2019-04-16 DIAGNOSIS — E119 Type 2 diabetes mellitus without complications: Secondary | ICD-10-CM | POA: Diagnosis not present

## 2019-04-16 DIAGNOSIS — J449 Chronic obstructive pulmonary disease, unspecified: Secondary | ICD-10-CM | POA: Diagnosis not present

## 2019-04-16 DIAGNOSIS — Z87891 Personal history of nicotine dependence: Secondary | ICD-10-CM | POA: Insufficient documentation

## 2019-04-16 DIAGNOSIS — I10 Essential (primary) hypertension: Secondary | ICD-10-CM | POA: Diagnosis not present

## 2019-04-16 DIAGNOSIS — Z79899 Other long term (current) drug therapy: Secondary | ICD-10-CM | POA: Insufficient documentation

## 2019-04-16 NOTE — Progress Notes (Signed)
Hematology/Oncology Consult note [facility]  [phone] [phone]  Patient Care Team: Adin Hector, MD as PCP - General (Internal Medicine) Adin Hector, MD (Internal Medicine) Bary Castilla Forest Gleason, MD (General Surgery)   Name of the patient: Sandra Burnett  [id number]  [birth year]   Date of visit: 04/16/19  Diagnosis- stage I ER/PR HER-2/neu negative right breast cancer  Chief complaint/ Reason for visit-routine follow-up of breast cancer  Heme/Onc history:  Oncology History Overview Note  2015- stage I (T1b N0 M0) ER/PR positive HER-2/neu-NEG invasive mammary carcinoma status post wide local excision; SLNBx [Dr.Byrnett] status post mammosite- November of 2015; Started on Femara in December of 2015.  # SEP 2016- BMD- OSTEOPENIA; mammo-NEG      Interval history-she is doing well on Arimidex.  She is on chronic oxygen for her COPD.  She has been following strict social distancing measures during this covid pandemic.  She also has chronic low back pain which has remained stable  ECOG PS- 1 Pain scale- 4   Review of systems- Review of Systems  Constitutional: Positive for malaise/fatigue. Negative for chills, fever and weight loss.  HENT: Negative for congestion, ear discharge and nosebleeds.   Eyes: Negative for blurred vision.  Respiratory: Positive for shortness of breath. Negative for cough, hemoptysis, sputum production and wheezing.   Cardiovascular: Negative for chest pain, palpitations, orthopnea and claudication.  Gastrointestinal: Negative for abdominal pain, blood in stool, constipation, diarrhea, heartburn, melena, nausea and vomiting.  Genitourinary: Negative for dysuria, flank pain, frequency, hematuria and urgency.  Musculoskeletal: Negative for back pain, joint pain and myalgias.  Skin: Negative for rash.  Neurological: Negative for dizziness, tingling, focal weakness, seizures, weakness and headaches.   Endo/Heme/Allergies: Does not bruise/bleed easily.  Psychiatric/Behavioral: Negative for depression and suicidal ideas. The patient does not have insomnia.       Allergies  Allergen Reactions  . Lisinopril Other (See Comments)    Severe hypotension  . Other Anaphylaxis and Other (See Comments)    Yellow dye Elevated LFTs "Yellow Dye"  . Codeine Nausea And Vomiting and Nausea Only    Other reaction(s): Vomiting Other reaction(s): GI Intolerance     Past Medical History:  Diagnosis Date  . Asthma   . Breast cancer ([place]) right   2015- mammosite   . COPD (chronic obstructive pulmonary disease) ([place])   . Diabetes mellitus without complication ([place])   . Hyperlipidemia   . Hypertension   . Osteopenia   . Osteoporosis   . Personal history of radiation therapy 2015   RIGHT lumpectomy -Mammosite  . Psoriasis   . Screen for colon cancer 08/02/2017   negative cologuard     Past Surgical History:  Procedure Laterality Date  . ABDOMINAL HYSTERECTOMY  1970's   partial  . APPENDECTOMY    . BREAST BIOPSY Bilateral 1980's  . BREAST BIOPSY Right 2015    INVASIVE MAMMARY CARCINOMA  . BREAST LUMPECTOMY Right 2015    INVASIVE MAMMARY CARCINOMA  . BREAST SURGERY Right 06/13/2014   8 mm ER+, PR+, Her 2 not over expressed, upper outer quadrant. Mammosite radiation.  . carpel tunnel Bilateral 1984  . CHOLECYSTECTOMY  1984  . COLONOSCOPY  2008   Dr Kristopher Glee  . TUBAL LIGATION      Social History   Socioeconomic History  . Marital status: Married    Spouse name: Not on file  . Number of children: Not on file  . Years of education:  Not on file  . Highest education level: Not on file  Occupational History  . Not on file  Social Needs  . Financial resource strain: Not on file  . Food insecurity    Worry: Not on file    Inability: Not on file  . Transportation needs    Medical: Not on file    Non-medical: Not on file  Tobacco Use  . Smoking status: Former Smoker     Packs/day: 1.00    Years: 40.00    Pack years: 40.00  . Smokeless tobacco: Never Used  Substance and Sexual Activity  . Alcohol use: No  . Drug use: No  . Sexual activity: Not on file  Lifestyle  . Physical activity    Days per week: Not on file    Minutes per session: Not on file  . Stress: Not on file  Relationships  . Social Herbalist on phone: Not on file    Gets together: Not on file    Attends religious service: Not on file    Active member of club or organization: Not on file    Attends meetings of clubs or organizations: Not on file    Relationship status: Not on file  . Intimate partner violence    Fear of current or ex partner: Not on file    Emotionally abused: Not on file    Physically abused: Not on file    Forced sexual activity: Not on file  Other Topics Concern  . Not on file  Social History Narrative  . Not on file    Family History  Problem Relation Age of Onset  . Pancreatitis Father   . Cancer Paternal Grandmother        breast  . Breast cancer Paternal Grandmother   . Breast cancer Paternal Aunt      Current Outpatient Medications:  .  albuterol (VENTOLIN HFA) 108 (90 Base) MCG/ACT inhaler, , Disp: , Rfl:  .  Calcium Carbonate-Vit D-Min (CALTRATE 600+D PLUS PO), Take 1 tablet by mouth 3 (three) times a week. , Disp: , Rfl:  .  clobetasol (TEMOVATE) 0.05 % external solution, USE 1 ML TWICE A DAY AS NEEDED. APPLY TO AFFECTED AREA ON SCALP. AVOID FACE/GROIN/AXILLA, Disp: , Rfl: 2 .  fexofenadine (ALLEGRA) 180 MG tablet, Take 1 tablet by mouth as needed., Disp: , Rfl:  .  Fluticasone-Salmeterol (ADVAIR) 100-50 MCG/DOSE AEPB, INHALE ONE PUFF 2 TIMES A DAY AS DIRECTED, Disp: , Rfl:  .  hydrochlorothiazide (HYDRODIURIL) 25 MG tablet, Take 25 mg by mouth daily., Disp: , Rfl:  .  letrozole (FEMARA) 2.5 MG tablet, TAKE 1 TABLET EVERY DAY, Disp: 90 tablet, Rfl: 3 .  lovastatin (MEVACOR) 40 MG tablet, Take 40 mg by mouth at bedtime., Disp: , Rfl:   .  meloxicam (MOBIC) 15 MG tablet, , Disp: , Rfl:  .  metFORMIN (GLUCOPHAGE) 500 MG tablet, TAKE ONE TABLET BY MOUTH 2 TIMES A DAY, Disp: , Rfl:  .  metoprolol tartrate (LOPRESSOR) 25 MG tablet, Take 25 mg by mouth 2 (two) times daily., Disp: , Rfl:  .  Omega-3 Fatty Acids (FISH OIL) 1000 MG CAPS, Take by mouth daily., Disp: , Rfl:  .  OXYGEN, Inhale 2 L into the lungs., Disp: , Rfl:  .  pyridOXINE (VITAMIN B-6) 100 MG tablet, Take 100 mg by mouth daily., Disp: , Rfl:  .  tiotropium (SPIRIVA) 18 MCG inhalation capsule, Place 18 mcg into inhaler and inhale  daily., Disp: , Rfl:  .  venlafaxine XR (EFFEXOR-XR) 150 MG 24 hr capsule, Take 150 mg by mouth daily with breakfast., Disp: , Rfl:  .  vitamin B-12 (CYANOCOBALAMIN) 100 MCG tablet, Take 100 mcg by mouth daily., Disp: , Rfl:  .  aspirin EC 81 MG tablet, Take by mouth., Disp: , Rfl:  .  EPINEPHrine 0.3 mg/0.3 mL IJ SOAJ injection, Inject 0.3 mg into the muscle once., Disp: , Rfl:  .  Multiple Vitamin (MULTIVITAMIN) capsule, Take 1 capsule by mouth daily., Disp: , Rfl:   Physical exam:  Vitals:   04/16/19 1116  BP: (!) 152/65  Pulse: 75  Temp: 98.7 F (37.1 C)  TempSrc: Tympanic  SpO2: 99%  Weight: 177 lb (80.3 kg)  Height: '5\' 4"'$  (1.626 m)   Physical Exam Constitutional:      General: She is not in acute distress.    Comments: On home oxygen  HENT:     Head: Normocephalic and atraumatic.  Eyes:     Pupils: Pupils are equal, round, and reactive to light.  Neck:     Musculoskeletal: Normal range of motion.  Cardiovascular:     Rate and Rhythm: Normal rate and regular rhythm.     Heart sounds: Normal heart sounds.  Pulmonary:     Effort: Pulmonary effort is normal.     Breath sounds: Normal breath sounds.  Abdominal:     General: Bowel sounds are normal.     Palpations: Abdomen is soft.  Skin:    General: Skin is warm and dry.  Neurological:     Mental Status: She is alert and oriented to person, place, and time.     Breast exam was performed in seated and lying down position. Patient is status post right lumpectomy with a well-healed surgical scar. No evidence of any palpable masses. No evidence of axillary adenopathy. No evidence of any palpable masses or lumps in the left breast. No evidence of leftt axillary adenopathy   CMP Latest Ref Rng & Units 11/03/2017  Glucose 65 - 99 mg/dL 139(H)  BUN 6 - 20 mg/dL 19  Creatinine 0.44 - 1.00 mg/dL 0.79  Sodium 135 - 145 mmol/L 137  Potassium 3.5 - 5.1 mmol/L 3.9  Chloride 101 - 111 mmol/L 102  CO2 22 - 32 mmol/L 26  Calcium 8.9 - 10.3 mg/dL 9.0  Total Protein 6.5 - 8.1 g/dL 7.2  Total Bilirubin 0.3 - 1.2 mg/dL 0.4  Alkaline Phos 38 - 126 U/L 77  AST 15 - 41 U/L 25  ALT 14 - 54 U/L 26   CBC Latest Ref Rng & Units 11/03/2017  WBC 3.6 - 11.0 K/uL 6.4  Hemoglobin 12.0 - 16.0 g/dL 13.4  Hematocrit 35.0 - 47.0 % 40.4  Platelets 150 - 440 K/uL 239    Assessment and plan- Patient is a 73 y.o. female with stage I ER PR positive HER-2/neu negative right breast cancer currently on Femara here for routine follow-up  Overall patient is tolerating Femara well and she is using it along with calcium and vitamin D.  She was wondering if she needs to take it for 10 years.  Given that she has stage I breast cancer which did not require adjuvant chemotherapy it would be reasonable for her to stop taking Femara after 5 years especially given her ongoing complaints of arthritis and baseline osteopenia.  She will finish Femara in December 2020.  I will order a diagnostic mammogram for her in November 2020.  Otherwise clinically patient is doing well and there are no concerning signs and symptoms of recurrence based on today's exam.  I will see him back in 1 year.  No labs   Visit Diagnosis 1. Carcinoma of upper-outer quadrant of right breast in female, estrogen receptor positive ([place])   2. Use of letrozole (Femara)      Dr. Randa Evens, MD, MPH [facility] [phone] 04/16/2019 12:45 PM

## 2019-04-16 NOTE — Progress Notes (Signed)
Patient stated that she had been doing well with no complaints. 

## 2019-04-30 ENCOUNTER — Encounter: Payer: Self-pay | Admitting: *Deleted

## 2019-06-21 ENCOUNTER — Ambulatory Visit
Admission: RE | Admit: 2019-06-21 | Discharge: 2019-06-21 | Disposition: A | Discharge status: Home / Self Care | Payer: Medicare Other | Source: Ambulatory Visit | Attending: Oncology | Admitting: Oncology

## 2019-06-21 DIAGNOSIS — C50411 Malignant neoplasm of upper-outer quadrant of right female breast: Secondary | ICD-10-CM

## 2019-06-21 DIAGNOSIS — Z170 Estrogen receptor positive status [ER+]: Secondary | ICD-10-CM

## 2019-06-27 ENCOUNTER — Ambulatory Visit: Payer: Self-pay | Admitting: Surgery

## 2019-10-17 ENCOUNTER — Other Ambulatory Visit: Payer: Self-pay | Admitting: Internal Medicine

## 2019-10-17 DIAGNOSIS — R413 Other amnesia: Secondary | ICD-10-CM

## 2019-10-23 ENCOUNTER — Other Ambulatory Visit: Payer: Self-pay

## 2019-10-23 ENCOUNTER — Ambulatory Visit
Admission: RE | Admit: 2019-10-23 | Discharge: 2019-10-23 | Disposition: A | Discharge status: Home / Self Care | Payer: Medicare Other | Source: Ambulatory Visit | Attending: Internal Medicine | Admitting: Internal Medicine

## 2019-10-23 DIAGNOSIS — R413 Other amnesia: Secondary | ICD-10-CM | POA: Diagnosis not present

## 2019-11-24 ENCOUNTER — Ambulatory Visit: Payer: Medicare Other | Attending: Internal Medicine

## 2019-11-24 DIAGNOSIS — Z23 Encounter for immunization: Secondary | ICD-10-CM

## 2019-11-24 NOTE — Progress Notes (Signed)
   Covid-19 Vaccination Clinic  Name:  Tashonna AVALYNN RASBURY    MRN: [id number] DOB: [birth year]  11/24/2019  Ms. Niezgoda was observed post Covid-19 immunization for 15 minutes without incident. She was provided with Vaccine Information Sheet and instruction to access the V-Safe system.   Ms. Stertz was instructed to call 911 with any severe reactions post vaccine: Marland Kitchen Difficulty breathing  . Swelling of face and throat  . A fast heartbeat  . A bad rash all over body  . Dizziness and weakness   Immunizations Administered    Name Date Dose VIS Date Route   Pfizer COVID-19 Vaccine 11/24/2019  9:54 AM 0.3 mL 07/27/2019 Intramuscular   Manufacturer: [place]   Lot: [id number]   [place]: [id number]

## 2019-12-19 ENCOUNTER — Ambulatory Visit: Payer: Medicare Other | Attending: Internal Medicine

## 2019-12-19 DIAGNOSIS — Z23 Encounter for immunization: Secondary | ICD-10-CM

## 2019-12-19 NOTE — Progress Notes (Signed)
   Covid-19 Vaccination Clinic  Name:  Sandra Burnett    MRN: [id number] DOB: [birth year]  12/19/2019  [name] was observed post Covid-19 immunization for 15 minutes without incident. She was provided with Vaccine Information Sheet and instruction to access the V-Safe system.   [name] was instructed to call 911 with any severe reactions post vaccine: Marland Kitchen Difficulty breathing  . Swelling of face and throat  . A fast heartbeat  . A bad rash all over body  . Dizziness and weakness   Immunizations Administered    Name Date Dose VIS Date Route   Pfizer COVID-19 Vaccine 12/19/2019  4:56 PM 0.3 mL 10/10/2018 Intramuscular   Manufacturer: [place]   Lot: [id number]   [place]: [id number]

## 2020-04-15 ENCOUNTER — Inpatient Hospital Stay: Payer: Medicare Other | Admitting: Oncology

## 2020-04-15 ENCOUNTER — Encounter: Payer: Self-pay | Admitting: Oncology

## 2020-04-15 ENCOUNTER — Telehealth: Payer: Self-pay | Admitting: Oncology

## 2020-04-15 NOTE — Telephone Encounter (Signed)
Patient missed appt on this date. Writer phoned patient and left voicemail regarding rescheduling. Writer also mailed patient a letter on this date requesting patient phone [place] to reschedule.

## 2020-05-06 ENCOUNTER — Inpatient Hospital Stay: Payer: Medicare Other | Attending: Oncology | Admitting: Oncology

## 2020-05-06 ENCOUNTER — Other Ambulatory Visit: Payer: Self-pay

## 2020-05-06 VITALS — BP 143/72 | HR 83 | Resp 16 | Wt 183.9 lb

## 2020-05-06 DIAGNOSIS — Z90710 Acquired absence of both cervix and uterus: Secondary | ICD-10-CM | POA: Insufficient documentation

## 2020-05-06 DIAGNOSIS — Z853 Personal history of malignant neoplasm of breast: Secondary | ICD-10-CM

## 2020-05-06 DIAGNOSIS — Z87891 Personal history of nicotine dependence: Secondary | ICD-10-CM | POA: Insufficient documentation

## 2020-05-06 DIAGNOSIS — Z803 Family history of malignant neoplasm of breast: Secondary | ICD-10-CM | POA: Diagnosis not present

## 2020-05-06 DIAGNOSIS — Z08 Encounter for follow-up examination after completed treatment for malignant neoplasm: Secondary | ICD-10-CM

## 2020-05-10 ENCOUNTER — Encounter: Payer: Self-pay | Admitting: Oncology

## 2020-05-10 NOTE — Progress Notes (Signed)
Hematology/Oncology Consult note [facility]  [phone] [phone]  Patient Care Team: Adin Hector, MD as PCP - General (Internal Medicine) Adin Hector, MD (Internal Medicine) Bary Castilla [place] Gleason, MD (General Surgery)   Name of the patient: Sandra Burnett  [id number]  12-23-1945   Date of visit: 05/10/20  Diagnosis- stage I ER/PR HER-2/neu negative right breast cancer  Chief complaint/ Reason for visit-routine follow-up of breast cancer  Heme/Onc history:  Oncology History Overview Note  2015- stage I (T1b N0 M0) ER/PR positive HER-2/neu-NEG invasive mammary carcinoma status post wide local excision; SLNBx [Dr.Byrnett] status post mammosite- November of 2015; Started on Femara in December of 2015.  # SEP 2016- BMD- OSTEOPENIA; mammo-NEG      Interval history-patient is doing well and is at her usual state of health.  She is vaccinated against Covid.  No recent hospitalizations.  ECOG PS- 1 Pain scale- 5- back pain chronic   Review of systems- Review of Systems  Constitutional: Positive for malaise/fatigue. Negative for chills, fever and weight loss.  HENT: Negative for congestion, ear discharge and nosebleeds.   Eyes: Negative for blurred vision.  Respiratory: Negative for cough, hemoptysis, sputum production, shortness of breath and wheezing.   Cardiovascular: Negative for chest pain, palpitations, orthopnea and claudication.  Gastrointestinal: Negative for abdominal pain, blood in stool, constipation, diarrhea, heartburn, melena, nausea and vomiting.  Genitourinary: Negative for dysuria, flank pain, frequency, hematuria and urgency.  Musculoskeletal: Positive for back pain. Negative for joint pain and myalgias.  Skin: Negative for rash.  Neurological: Negative for dizziness, tingling, focal weakness, seizures, weakness and headaches.  Endo/Heme/Allergies: Does not bruise/bleed easily.  Psychiatric/Behavioral:  Negative for depression and suicidal ideas. The patient does not have insomnia.        Allergies  Allergen Reactions  . Lisinopril Other (See Comments)    Severe hypotension  . Other Anaphylaxis and Other (See Comments)    Yellow dye Elevated LFTs "Yellow Dye"  . Codeine Nausea And Vomiting and Nausea Only    Other reaction(s): Vomiting Other reaction(s): GI Intolerance     Past Medical History:  Diagnosis Date  . Asthma   . Breast cancer ([place]) right   2015- mammosite   . COPD (chronic obstructive pulmonary disease) ([place])   . Diabetes mellitus without complication ([place])   . Hyperlipidemia   . Hypertension   . Osteopenia   . Osteoporosis   . Personal history of radiation therapy 2015   RIGHT lumpectomy -Mammosite  . Psoriasis   . Screen for colon cancer 08/02/2017   negative cologuard     Past Surgical History:  Procedure Laterality Date  . ABDOMINAL HYSTERECTOMY  1970's   partial  . APPENDECTOMY    . BREAST BIOPSY Bilateral 1980's  . BREAST BIOPSY Right 2015    INVASIVE MAMMARY CARCINOMA  . BREAST LUMPECTOMY Right 2015    INVASIVE MAMMARY CARCINOMA  . BREAST SURGERY Right 06/13/2014   8 mm ER+, PR+, Her 2 not over expressed, upper outer quadrant. Mammosite radiation.  . carpel tunnel Bilateral 1984  . CHOLECYSTECTOMY  1984  . COLONOSCOPY  2008   Dr Kristopher Glee  . TUBAL LIGATION      Social History   Socioeconomic History  . Marital status: Married    Spouse name: Not on file  . Number of children: Not on file  . Years of education: Not on file  . Highest education level: Not on file  Occupational History  .  Not on file  Tobacco Use  . Smoking status: Former Smoker    Packs/day: 1.00    Years: 40.00    Pack years: 40.00  . Smokeless tobacco: Never Used  Substance and Sexual Activity  . Alcohol use: No  . Drug use: No  . Sexual activity: Not on file  Other Topics Concern  . Not on file  Social History Narrative  . Not on file   Social  Determinants of Health   Financial Resource Strain:   . Difficulty of Paying Living Expenses: Not on file  Food Insecurity:   . Worried About Charity fundraiser in the Last Year: Not on file  . Ran Out of Food in the Last Year: Not on file  Transportation Needs:   . Lack of Transportation (Medical): Not on file  . Lack of Transportation (Non-Medical): Not on file  Physical Activity:   . Days of Exercise per Week: Not on file  . Minutes of Exercise per Session: Not on file  Stress:   . Feeling of Stress : Not on file  Social Connections:   . Frequency of Communication with Friends and Family: Not on file  . Frequency of Social Gatherings with Friends and Family: Not on file  . Attends Religious Services: Not on file  . Active Member of Clubs or Organizations: Not on file  . Attends Archivist Meetings: Not on file  . Marital Status: Not on file  Intimate Partner Violence:   . Fear of Current or Ex-Partner: Not on file  . Emotionally Abused: Not on file  . Physically Abused: Not on file  . Sexually Abused: Not on file    Family History  Problem Relation Age of Onset  . Pancreatitis Father   . Cancer Paternal Grandmother        breast  . Breast cancer Paternal Grandmother   . Breast cancer Paternal Aunt      Current Outpatient Medications:  .  albuterol (VENTOLIN HFA) 108 (90 Base) MCG/ACT inhaler, , Disp: , Rfl:  .  aspirin EC 81 MG tablet, Take by mouth., Disp: , Rfl:  .  Calcium Carbonate-Vit D-Min (CALTRATE 600+D PLUS PO), Take 1 tablet by mouth 3 (three) times a week. , Disp: , Rfl:  .  clobetasol (TEMOVATE) 0.05 % external solution, USE 1 ML TWICE A DAY AS NEEDED. APPLY TO AFFECTED AREA ON SCALP. AVOID FACE/GROIN/AXILLA, Disp: , Rfl: 2 .  EPINEPHrine 0.3 mg/0.3 mL IJ SOAJ injection, Inject 0.3 mg into the muscle once., Disp: , Rfl:  .  fexofenadine (ALLEGRA) 180 MG tablet, Take 1 tablet by mouth as needed., Disp: , Rfl:  .  Fluticasone-Salmeterol (ADVAIR)  100-50 MCG/DOSE AEPB, INHALE ONE PUFF 2 TIMES A DAY AS DIRECTED, Disp: , Rfl:  .  hydrochlorothiazide (HYDRODIURIL) 25 MG tablet, Take 25 mg by mouth daily., Disp: , Rfl:  .  letrozole (FEMARA) 2.5 MG tablet, TAKE 1 TABLET EVERY DAY, Disp: 90 tablet, Rfl: 3 .  lovastatin (MEVACOR) 40 MG tablet, Take 40 mg by mouth at bedtime., Disp: , Rfl:  .  meloxicam (MOBIC) 15 MG tablet, , Disp: , Rfl:  .  metFORMIN (GLUCOPHAGE) 500 MG tablet, TAKE ONE TABLET BY MOUTH 2 TIMES A DAY, Disp: , Rfl:  .  metoprolol tartrate (LOPRESSOR) 25 MG tablet, Take 25 mg by mouth 2 (two) times daily., Disp: , Rfl:  .  Multiple Vitamin (MULTIVITAMIN) capsule, Take 1 capsule by mouth daily., Disp: , Rfl:  .  Omega-3 Fatty Acids (FISH OIL) 1000 MG CAPS, Take by mouth daily., Disp: , Rfl:  .  OXYGEN, Inhale 2 L into the lungs., Disp: , Rfl:  .  pyridOXINE (VITAMIN B-6) 100 MG tablet, Take 100 mg by mouth daily., Disp: , Rfl:  .  tiotropium (SPIRIVA) 18 MCG inhalation capsule, Place 18 mcg into inhaler and inhale daily., Disp: , Rfl:  .  venlafaxine XR (EFFEXOR-XR) 150 MG 24 hr capsule, Take 150 mg by mouth daily with breakfast., Disp: , Rfl:  .  vitamin B-12 (CYANOCOBALAMIN) 100 MCG tablet, Take 100 mcg by mouth daily., Disp: , Rfl:   Physical exam:  Vitals:   05/06/20 1118  BP: (!) 143/72  Pulse: 83  Resp: 16  SpO2: 100%  Weight: 183 lb 14.4 oz (83.4 kg)   Physical Exam Constitutional:      Comments: On home O2  Cardiovascular:     Rate and Rhythm: Normal rate and regular rhythm.     Heart sounds: Normal heart sounds.  Pulmonary:     Effort: Pulmonary effort is normal.     Breath sounds: Normal breath sounds.  Abdominal:     General: Bowel sounds are normal.     Palpations: Abdomen is soft.  Musculoskeletal:     Cervical back: Normal range of motion.  Skin:    General: Skin is warm and dry.  Neurological:     Mental Status: She is alert and oriented to person, place, and time.    Breast exam was performed  in seated and lying down position. Patient is status post right lumpectomy with a well-healed surgical scar. No evidence of any palpable masses. No evidence of axillary adenopathy. No evidence of any palpable masses or lumps in the left breast. No evidence of leftt axillary adenopathy    CMP Latest Ref Rng & Units 11/03/2017  Glucose 65 - 99 mg/dL 139(H)  BUN 6 - 20 mg/dL 19  Creatinine 0.44 - 1.00 mg/dL 0.79  Sodium 135 - 145 mmol/L 137  Potassium 3.5 - 5.1 mmol/L 3.9  Chloride 101 - 111 mmol/L 102  CO2 22 - 32 mmol/L 26  Calcium 8.9 - 10.3 mg/dL 9.0  Total Protein 6.5 - 8.1 g/dL 7.2  Total Bilirubin 0.3 - 1.2 mg/dL 0.4  Alkaline Phos 38 - 126 U/L 77  AST 15 - 41 U/L 25  ALT 14 - 54 U/L 26   CBC Latest Ref Rng & Units 11/03/2017  WBC 3.6 - 11.0 K/uL 6.4  Hemoglobin 12.0 - 16.0 g/dL 13.4  Hematocrit 35 - 47 % 40.4  Platelets 150 - 440 K/uL 239   Assessment and plan- Patient is a 74 y.o. female with stage I ER/PR positive HER-2 negative right breast cancer in 2015 s/p surgery adjuvant radiation therapy and 5 years of Femara.  This is a routine follow-up visit  Clinically patient is doing well with no concerning signs and symptoms of recurrence based on today's exam.  She has completed 5 years of hormone therapy.  Recent Mammogram from November 2020 was normal and we will schedule one for November of this year.  I will see her back in 1 year no labs   Visit Diagnosis 1. Encounter for follow-up surveillance of breast cancer      Dr. Randa Evens, MD, MPH [facility] [phone] 05/10/2020 9:29 AM

## 2020-08-24 DIAGNOSIS — J449 Chronic obstructive pulmonary disease, unspecified: Secondary | ICD-10-CM | POA: Diagnosis not present

## 2020-08-24 DIAGNOSIS — E7849 Other hyperlipidemia: Secondary | ICD-10-CM | POA: Diagnosis not present

## 2020-08-24 DIAGNOSIS — E119 Type 2 diabetes mellitus without complications: Secondary | ICD-10-CM | POA: Diagnosis not present

## 2020-08-24 DIAGNOSIS — I700 Atherosclerosis of aorta: Secondary | ICD-10-CM | POA: Diagnosis not present

## 2020-09-17 DIAGNOSIS — M7541 Impingement syndrome of right shoulder: Secondary | ICD-10-CM | POA: Diagnosis not present

## 2020-09-24 DIAGNOSIS — I700 Atherosclerosis of aorta: Secondary | ICD-10-CM | POA: Diagnosis not present

## 2020-09-24 DIAGNOSIS — J449 Chronic obstructive pulmonary disease, unspecified: Secondary | ICD-10-CM | POA: Diagnosis not present

## 2020-09-24 DIAGNOSIS — E7849 Other hyperlipidemia: Secondary | ICD-10-CM | POA: Diagnosis not present

## 2020-09-24 DIAGNOSIS — E119 Type 2 diabetes mellitus without complications: Secondary | ICD-10-CM | POA: Diagnosis not present

## 2020-10-06 DIAGNOSIS — M7541 Impingement syndrome of right shoulder: Secondary | ICD-10-CM | POA: Diagnosis not present

## 2020-10-07 DIAGNOSIS — M25511 Pain in right shoulder: Secondary | ICD-10-CM | POA: Diagnosis not present

## 2020-10-22 DIAGNOSIS — E7849 Other hyperlipidemia: Secondary | ICD-10-CM | POA: Diagnosis not present

## 2020-10-22 DIAGNOSIS — I700 Atherosclerosis of aorta: Secondary | ICD-10-CM | POA: Diagnosis not present

## 2020-10-22 DIAGNOSIS — M25511 Pain in right shoulder: Secondary | ICD-10-CM | POA: Diagnosis not present

## 2020-10-22 DIAGNOSIS — J449 Chronic obstructive pulmonary disease, unspecified: Secondary | ICD-10-CM | POA: Diagnosis not present

## 2020-10-22 DIAGNOSIS — E119 Type 2 diabetes mellitus without complications: Secondary | ICD-10-CM | POA: Diagnosis not present

## 2020-10-27 DIAGNOSIS — M25511 Pain in right shoulder: Secondary | ICD-10-CM | POA: Diagnosis not present

## 2020-10-29 DIAGNOSIS — M25511 Pain in right shoulder: Secondary | ICD-10-CM | POA: Diagnosis not present

## 2020-11-06 DIAGNOSIS — M25511 Pain in right shoulder: Secondary | ICD-10-CM | POA: Diagnosis not present

## 2020-11-20 DIAGNOSIS — M7541 Impingement syndrome of right shoulder: Secondary | ICD-10-CM | POA: Diagnosis not present

## 2020-11-22 DIAGNOSIS — E7849 Other hyperlipidemia: Secondary | ICD-10-CM | POA: Diagnosis not present

## 2020-11-22 DIAGNOSIS — J449 Chronic obstructive pulmonary disease, unspecified: Secondary | ICD-10-CM | POA: Diagnosis not present

## 2020-11-22 DIAGNOSIS — I700 Atherosclerosis of aorta: Secondary | ICD-10-CM | POA: Diagnosis not present

## 2020-11-22 DIAGNOSIS — E119 Type 2 diabetes mellitus without complications: Secondary | ICD-10-CM | POA: Diagnosis not present

## 2020-11-25 DIAGNOSIS — I700 Atherosclerosis of aorta: Secondary | ICD-10-CM | POA: Diagnosis not present

## 2020-11-25 DIAGNOSIS — J9611 Chronic respiratory failure with hypoxia: Secondary | ICD-10-CM | POA: Diagnosis not present

## 2020-11-25 DIAGNOSIS — J449 Chronic obstructive pulmonary disease, unspecified: Secondary | ICD-10-CM | POA: Diagnosis not present

## 2020-11-25 DIAGNOSIS — E7849 Other hyperlipidemia: Secondary | ICD-10-CM | POA: Diagnosis not present

## 2020-11-25 DIAGNOSIS — R0902 Hypoxemia: Secondary | ICD-10-CM | POA: Diagnosis not present

## 2020-11-25 DIAGNOSIS — E1140 Type 2 diabetes mellitus with diabetic neuropathy, unspecified: Secondary | ICD-10-CM | POA: Diagnosis not present

## 2020-11-25 DIAGNOSIS — F325 Major depressive disorder, single episode, in full remission: Secondary | ICD-10-CM | POA: Diagnosis not present

## 2020-11-25 DIAGNOSIS — E119 Type 2 diabetes mellitus without complications: Secondary | ICD-10-CM | POA: Diagnosis not present

## 2020-11-25 DIAGNOSIS — J45909 Unspecified asthma, uncomplicated: Secondary | ICD-10-CM | POA: Diagnosis not present

## 2020-11-25 DIAGNOSIS — J9690 Respiratory failure, unspecified, unspecified whether with hypoxia or hypercapnia: Secondary | ICD-10-CM | POA: Diagnosis not present

## 2020-11-25 DIAGNOSIS — E538 Deficiency of other specified B group vitamins: Secondary | ICD-10-CM | POA: Diagnosis not present

## 2020-11-25 DIAGNOSIS — I10 Essential (primary) hypertension: Secondary | ICD-10-CM | POA: Diagnosis not present

## 2020-12-03 DIAGNOSIS — M7541 Impingement syndrome of right shoulder: Secondary | ICD-10-CM | POA: Diagnosis not present

## 2020-12-08 DIAGNOSIS — M75121 Complete rotator cuff tear or rupture of right shoulder, not specified as traumatic: Secondary | ICD-10-CM | POA: Diagnosis not present

## 2020-12-15 DIAGNOSIS — M75121 Complete rotator cuff tear or rupture of right shoulder, not specified as traumatic: Secondary | ICD-10-CM | POA: Diagnosis not present

## 2020-12-22 DIAGNOSIS — E7849 Other hyperlipidemia: Secondary | ICD-10-CM | POA: Diagnosis not present

## 2020-12-22 DIAGNOSIS — E119 Type 2 diabetes mellitus without complications: Secondary | ICD-10-CM | POA: Diagnosis not present

## 2020-12-22 DIAGNOSIS — J449 Chronic obstructive pulmonary disease, unspecified: Secondary | ICD-10-CM | POA: Diagnosis not present

## 2020-12-22 DIAGNOSIS — I700 Atherosclerosis of aorta: Secondary | ICD-10-CM | POA: Diagnosis not present

## 2020-12-23 ENCOUNTER — Encounter: Payer: Self-pay | Admitting: Oncology

## 2020-12-23 ENCOUNTER — Inpatient Hospital Stay: Payer: Medicare HMO | Attending: Oncology | Admitting: Oncology

## 2020-12-23 VITALS — BP 123/87 | HR 72 | Temp 98.2°F | Resp 20 | Wt 193.4 lb

## 2020-12-23 DIAGNOSIS — Z9981 Dependence on supplemental oxygen: Secondary | ICD-10-CM | POA: Insufficient documentation

## 2020-12-23 DIAGNOSIS — Z853 Personal history of malignant neoplasm of breast: Secondary | ICD-10-CM | POA: Insufficient documentation

## 2020-12-23 DIAGNOSIS — Z08 Encounter for follow-up examination after completed treatment for malignant neoplasm: Secondary | ICD-10-CM

## 2020-12-23 NOTE — Progress Notes (Signed)
Pt in for follow up reports generalized pain.  Wears oxygen at 2 liters portable and at home. Reports was unable to have mammogram in October due.

## 2020-12-23 NOTE — Progress Notes (Signed)
Hematology/Oncology Consult note [facility]  Telephone:([phone] [phone]  Patient Care Team: Adin Hector, MD as PCP - General (Internal Medicine) Adin Hector, MD (Internal Medicine) Bary Castilla Forest Gleason, MD (General Surgery)   Name of the patient: Sandra Burnett  [id number]  1946/01/09   Date of visit: 12/23/20  Diagnosis- stage I ER/PR HER-2/neu negative right breast cancer  Chief complaint/ Reason for visit-routine follow-up of breast cancer  Heme/Onc history:  Oncology History Overview Note  2015- stage I (T1b N0 M0) ER/PR positive HER-2/neu-NEG invasive mammary carcinoma status post wide local excision; SLNBx [Dr.Byrnett] status post mammosite- November of 2015; Started on Femara in December of 2015.  # SEP 2016- BMD- OSTEOPENIA; mammo-NEG       Interval history-patient recovered from [place] in February 2022.  She feels at her baseline state of health.  She is on home oxygen.  She has chronic her right shoulder pain as well as low back pain for which she sees orthopedics  ECOG PS- 1 Pain scale- 4   Review of systems- Review of Systems  Constitutional: Negative for chills, fever, malaise/fatigue and weight loss.  HENT: Negative for congestion, ear discharge and nosebleeds.   Eyes: Negative for blurred vision.  Respiratory: Negative for cough, hemoptysis, sputum production, shortness of breath and wheezing.   Cardiovascular: Negative for chest pain, palpitations, orthopnea and claudication.  Gastrointestinal: Negative for abdominal pain, blood in stool, constipation, diarrhea, heartburn, melena, nausea and vomiting.  Genitourinary: Negative for dysuria, flank pain, frequency, hematuria and urgency.  Musculoskeletal: Positive for back pain and joint pain (Right shoulder pain). Negative for myalgias.  Skin: Negative for rash.  Neurological: Negative for dizziness, tingling, focal weakness, seizures, weakness and headaches.   Endo/Heme/Allergies: Does not bruise/bleed easily.  Psychiatric/Behavioral: Negative for depression and suicidal ideas. The patient does not have insomnia.        Allergies  Allergen Reactions  . Lisinopril Other (See Comments)    Severe hypotension  . Other Anaphylaxis and Other (See Comments)    Yellow dye Elevated LFTs "Yellow Dye"  . Codeine Nausea And Vomiting and Nausea Only    Other reaction(s): Vomiting Other reaction(s): GI Intolerance     Past Medical History:  Diagnosis Date  . Asthma   . Breast cancer ([place]) right   2015- mammosite   . COPD (chronic obstructive pulmonary disease) ([place])   . Diabetes mellitus without complication ([place])   . Hyperlipidemia   . Hypertension   . Osteopenia   . Osteoporosis   . Personal history of radiation therapy 2015   RIGHT lumpectomy -Mammosite  . Psoriasis   . Screen for colon cancer 08/02/2017   negative cologuard     Past Surgical History:  Procedure Laterality Date  . ABDOMINAL HYSTERECTOMY  1970's   partial  . APPENDECTOMY    . BREAST BIOPSY Bilateral 1980's  . BREAST BIOPSY Right 2015    INVASIVE MAMMARY CARCINOMA  . BREAST LUMPECTOMY Right 2015    INVASIVE MAMMARY CARCINOMA  . BREAST SURGERY Right 06/13/2014   8 mm ER+, PR+, Her 2 not over expressed, upper outer quadrant. Mammosite radiation.  . carpel tunnel Bilateral 1984  . CHOLECYSTECTOMY  1984  . COLONOSCOPY  2008   Dr Kristopher Glee  . TUBAL LIGATION      Social History   Socioeconomic History  . Marital status: Married    Spouse name: Not on file  . Number of children: Not on file  .  Years of education: Not on file  . Highest education level: Not on file  Occupational History  . Not on file  Tobacco Use  . Smoking status: Former Smoker    Packs/day: 1.00    Years: 40.00    Pack years: 40.00  . Smokeless tobacco: Never Used  Substance and Sexual Activity  . Alcohol use: No  . Drug use: No  . Sexual activity: Not on file  Other Topics  Concern  . Not on file  Social History Narrative  . Not on file   Social Determinants of Health   Financial Resource Strain: Not on file  Food Insecurity: Not on file  Transportation Needs: Not on file  Physical Activity: Not on file  Stress: Not on file  Social Connections: Not on file  Intimate Partner Violence: Not on file    Family History  Problem Relation Age of Onset  . Pancreatitis Father   . Cancer Paternal Grandmother        breast  . Breast cancer Paternal Grandmother   . Breast cancer Paternal Aunt      Current Outpatient Medications:  .  albuterol (VENTOLIN HFA) 108 (90 Base) MCG/ACT inhaler, , Disp: , Rfl:  .  aspirin EC 81 MG tablet, Take by mouth., Disp: , Rfl:  .  azelastine (ASTELIN) 0.1 % nasal spray, azelastine 137 mcg (0.1 %) nasal spray aerosol  PLACE 1 SPRAY INTO BOTH NOSTRILS 2 (TWO) TIMES DAILY AS NEEDED FOR RHINITIS, Disp: , Rfl:  .  Calcium Carbonate-Vit D-Min (CALTRATE 600+D PLUS PO), Take 1 tablet by mouth 3 (three) times a week. , Disp: , Rfl:  .  clobetasol (TEMOVATE) 0.05 % external solution, USE 1 ML TWICE A DAY AS NEEDED. APPLY TO AFFECTED AREA ON SCALP. AVOID FACE/GROIN/AXILLA, Disp: , Rfl: 2 .  EPINEPHrine 0.3 mg/0.3 mL IJ SOAJ injection, Inject 0.3 mg into the muscle once., Disp: , Rfl:  .  fexofenadine (ALLEGRA) 180 MG tablet, Take 1 tablet by mouth as needed., Disp: , Rfl:  .  Fluticasone-Salmeterol (ADVAIR) 100-50 MCG/DOSE AEPB, INHALE ONE PUFF 2 TIMES A DAY AS DIRECTED, Disp: , Rfl:  .  hydrochlorothiazide (HYDRODIURIL) 25 MG tablet, Take 25 mg by mouth daily., Disp: , Rfl:  .  lovastatin (MEVACOR) 40 MG tablet, Take 40 mg by mouth at bedtime., Disp: , Rfl:  .  meloxicam (MOBIC) 15 MG tablet, , Disp: , Rfl:  .  metFORMIN (GLUCOPHAGE) 500 MG tablet, TAKE ONE TABLET BY MOUTH 2 TIMES A DAY, Disp: , Rfl:  .  metoprolol tartrate (LOPRESSOR) 25 MG tablet, Take 25 mg by mouth 2 (two) times daily., Disp: , Rfl:  .  Multiple Vitamin  (MULTIVITAMIN) capsule, Take 1 capsule by mouth daily., Disp: , Rfl:  .  Omega-3 Fatty Acids (FISH OIL) 1000 MG CAPS, Take by mouth daily., Disp: , Rfl:  .  OXYGEN, Inhale 2 L into the lungs., Disp: , Rfl:  .  pyridOXINE (VITAMIN B-6) 100 MG tablet, Take 100 mg by mouth daily., Disp: , Rfl:  .  tiotropium (SPIRIVA) 18 MCG inhalation capsule, Place 18 mcg into inhaler and inhale daily., Disp: , Rfl:  .  venlafaxine XR (EFFEXOR-XR) 150 MG 24 hr capsule, Take 150 mg by mouth daily with breakfast., Disp: , Rfl:  .  vitamin B-12 (CYANOCOBALAMIN) 100 MCG tablet, Take 100 mcg by mouth daily., Disp: , Rfl:   Physical exam:  Vitals:   12/23/20 1034  BP: 123/87  Pulse: 72  Resp:  20  Temp: 98.2 F (36.8 C)  TempSrc: Tympanic  SpO2: 98%  Weight: 193 lb 6.4 oz (87.7 kg)   Physical Exam Constitutional:      Comments: On home oxygen  Cardiovascular:     Rate and Rhythm: Normal rate and regular rhythm.     Heart sounds: Normal heart sounds.  Pulmonary:     Effort: Pulmonary effort is normal.     Breath sounds: Normal breath sounds.  Skin:    General: Skin is warm and dry.  Neurological:     Mental Status: She is alert and oriented to person, place, and time.    Breast exam was performed in seated and lying down position. Patient is status post right lumpectomy with a well-healed surgical scar. No evidence of any palpable masses. No evidence of axillary adenopathy. No evidence of any palpable masses or lumps in the left breast. No evidence of leftt axillary adenopathy  CMP Latest Ref Rng & Units 11/03/2017  Glucose 65 - 99 mg/dL 139(H)  BUN 6 - 20 mg/dL 19  Creatinine 0.44 - 1.00 mg/dL 0.79  Sodium 135 - 145 mmol/L 137  Potassium 3.5 - 5.1 mmol/L 3.9  Chloride 101 - 111 mmol/L 102  CO2 22 - 32 mmol/L 26  Calcium 8.9 - 10.3 mg/dL 9.0  Total Protein 6.5 - 8.1 g/dL 7.2  Total Bilirubin 0.3 - 1.2 mg/dL 0.4  Alkaline Phos 38 - 126 U/L 77  AST 15 - 41 U/L 25  ALT 14 - 54 U/L 26   CBC  Latest Ref Rng & Units 11/03/2017  WBC 3.6 - 11.0 K/uL 6.4  Hemoglobin 12.0 - 16.0 g/dL 13.4  Hematocrit 35.0 - 47.0 % 40.4  Platelets 150 - 440 K/uL 239      Assessment and plan- Patient is a 75 y.o. female With history of stage I right breast cancer in 2015 ER/PR positive HER2 negative s/p surgery adjuvant radiation treatment and 5 years of hormone therapy here for routine follow-up  Clinically patient is doing well with no concerning signs and symptoms of recurrence based on today's exam.  Has been more than 5 years since her breast cancer diagnosis but patient would like to follow-up with [place] on a yearly basis for breast exam.  I will see her back in 1 year.  She is overdue for a mammogram which we will order now   Visit Diagnosis 1. Encounter for follow-up surveillance of breast cancer      Dr. Randa Evens, MD, MPH [facility] [phone] 12/23/2020 3:02 PM

## 2020-12-30 ENCOUNTER — Other Ambulatory Visit: Payer: Self-pay

## 2020-12-30 ENCOUNTER — Ambulatory Visit
Admission: RE | Admit: 2020-12-30 | Discharge: 2020-12-30 | Disposition: A | Discharge status: Home / Self Care | Payer: Medicare HMO | Source: Ambulatory Visit | Attending: Oncology | Admitting: Oncology

## 2020-12-30 DIAGNOSIS — Z1231 Encounter for screening mammogram for malignant neoplasm of breast: Secondary | ICD-10-CM | POA: Insufficient documentation

## 2020-12-30 DIAGNOSIS — Z08 Encounter for follow-up examination after completed treatment for malignant neoplasm: Secondary | ICD-10-CM

## 2020-12-30 DIAGNOSIS — Z853 Personal history of malignant neoplasm of breast: Secondary | ICD-10-CM | POA: Insufficient documentation

## 2020-12-30 LAB — COLOGUARD

## 2021-01-22 DIAGNOSIS — E119 Type 2 diabetes mellitus without complications: Secondary | ICD-10-CM | POA: Diagnosis not present

## 2021-01-22 DIAGNOSIS — I700 Atherosclerosis of aorta: Secondary | ICD-10-CM | POA: Diagnosis not present

## 2021-01-22 DIAGNOSIS — J449 Chronic obstructive pulmonary disease, unspecified: Secondary | ICD-10-CM | POA: Diagnosis not present

## 2021-01-22 DIAGNOSIS — E7849 Other hyperlipidemia: Secondary | ICD-10-CM | POA: Diagnosis not present

## 2021-02-09 DIAGNOSIS — M545 Low back pain, unspecified: Secondary | ICD-10-CM | POA: Diagnosis not present

## 2021-02-09 DIAGNOSIS — M75120 Complete rotator cuff tear or rupture of unspecified shoulder, not specified as traumatic: Secondary | ICD-10-CM | POA: Diagnosis not present

## 2021-02-12 DIAGNOSIS — J449 Chronic obstructive pulmonary disease, unspecified: Secondary | ICD-10-CM | POA: Diagnosis not present

## 2021-02-21 DIAGNOSIS — E119 Type 2 diabetes mellitus without complications: Secondary | ICD-10-CM | POA: Diagnosis not present

## 2021-02-21 DIAGNOSIS — J449 Chronic obstructive pulmonary disease, unspecified: Secondary | ICD-10-CM | POA: Diagnosis not present

## 2021-02-21 DIAGNOSIS — I700 Atherosclerosis of aorta: Secondary | ICD-10-CM | POA: Diagnosis not present

## 2021-02-21 DIAGNOSIS — E7849 Other hyperlipidemia: Secondary | ICD-10-CM | POA: Diagnosis not present

## 2021-03-23 DIAGNOSIS — M545 Low back pain, unspecified: Secondary | ICD-10-CM | POA: Diagnosis not present

## 2021-03-23 DIAGNOSIS — M25511 Pain in right shoulder: Secondary | ICD-10-CM | POA: Diagnosis not present

## 2021-03-23 DIAGNOSIS — M4316 Spondylolisthesis, lumbar region: Secondary | ICD-10-CM | POA: Diagnosis not present

## 2021-03-23 DIAGNOSIS — Z5181 Encounter for therapeutic drug level monitoring: Secondary | ICD-10-CM | POA: Diagnosis not present

## 2021-03-23 DIAGNOSIS — M75120 Complete rotator cuff tear or rupture of unspecified shoulder, not specified as traumatic: Secondary | ICD-10-CM | POA: Diagnosis not present

## 2021-03-23 DIAGNOSIS — Z79899 Other long term (current) drug therapy: Secondary | ICD-10-CM | POA: Diagnosis not present

## 2021-03-23 DIAGNOSIS — M47817 Spondylosis without myelopathy or radiculopathy, lumbosacral region: Secondary | ICD-10-CM | POA: Diagnosis not present

## 2021-03-23 DIAGNOSIS — Z79891 Long term (current) use of opiate analgesic: Secondary | ICD-10-CM | POA: Diagnosis not present

## 2021-03-24 DIAGNOSIS — E7849 Other hyperlipidemia: Secondary | ICD-10-CM | POA: Diagnosis not present

## 2021-03-24 DIAGNOSIS — I700 Atherosclerosis of aorta: Secondary | ICD-10-CM | POA: Diagnosis not present

## 2021-03-24 DIAGNOSIS — J449 Chronic obstructive pulmonary disease, unspecified: Secondary | ICD-10-CM | POA: Diagnosis not present

## 2021-03-24 DIAGNOSIS — E119 Type 2 diabetes mellitus without complications: Secondary | ICD-10-CM | POA: Diagnosis not present

## 2021-03-31 DIAGNOSIS — C50411 Malignant neoplasm of upper-outer quadrant of right female breast: Secondary | ICD-10-CM | POA: Diagnosis not present

## 2021-03-31 DIAGNOSIS — E538 Deficiency of other specified B group vitamins: Secondary | ICD-10-CM | POA: Diagnosis not present

## 2021-03-31 DIAGNOSIS — F325 Major depressive disorder, single episode, in full remission: Secondary | ICD-10-CM | POA: Diagnosis not present

## 2021-03-31 DIAGNOSIS — E1140 Type 2 diabetes mellitus with diabetic neuropathy, unspecified: Secondary | ICD-10-CM | POA: Diagnosis not present

## 2021-03-31 DIAGNOSIS — J449 Chronic obstructive pulmonary disease, unspecified: Secondary | ICD-10-CM | POA: Diagnosis not present

## 2021-03-31 DIAGNOSIS — I700 Atherosclerosis of aorta: Secondary | ICD-10-CM | POA: Diagnosis not present

## 2021-03-31 DIAGNOSIS — E785 Hyperlipidemia, unspecified: Secondary | ICD-10-CM | POA: Diagnosis not present

## 2021-03-31 DIAGNOSIS — I10 Essential (primary) hypertension: Secondary | ICD-10-CM | POA: Diagnosis not present

## 2021-03-31 DIAGNOSIS — Z0000 Encounter for general adult medical examination without abnormal findings: Secondary | ICD-10-CM | POA: Diagnosis not present

## 2021-03-31 DIAGNOSIS — J9611 Chronic respiratory failure with hypoxia: Secondary | ICD-10-CM | POA: Diagnosis not present

## 2021-03-31 DIAGNOSIS — E119 Type 2 diabetes mellitus without complications: Secondary | ICD-10-CM | POA: Diagnosis not present

## 2021-03-31 DIAGNOSIS — E7849 Other hyperlipidemia: Secondary | ICD-10-CM | POA: Diagnosis not present

## 2021-04-01 DIAGNOSIS — M25511 Pain in right shoulder: Secondary | ICD-10-CM | POA: Diagnosis not present

## 2021-04-13 DIAGNOSIS — M545 Low back pain, unspecified: Secondary | ICD-10-CM | POA: Diagnosis not present

## 2021-04-14 DIAGNOSIS — J449 Chronic obstructive pulmonary disease, unspecified: Secondary | ICD-10-CM | POA: Diagnosis not present

## 2021-04-14 DIAGNOSIS — Z780 Asymptomatic menopausal state: Secondary | ICD-10-CM | POA: Diagnosis not present

## 2021-04-15 ENCOUNTER — Other Ambulatory Visit: Payer: Self-pay | Admitting: Pulmonary Disease

## 2021-04-15 DIAGNOSIS — J449 Chronic obstructive pulmonary disease, unspecified: Secondary | ICD-10-CM

## 2021-04-24 DIAGNOSIS — E119 Type 2 diabetes mellitus without complications: Secondary | ICD-10-CM | POA: Diagnosis not present

## 2021-04-24 DIAGNOSIS — J449 Chronic obstructive pulmonary disease, unspecified: Secondary | ICD-10-CM | POA: Diagnosis not present

## 2021-04-24 DIAGNOSIS — I700 Atherosclerosis of aorta: Secondary | ICD-10-CM | POA: Diagnosis not present

## 2021-04-24 DIAGNOSIS — E7849 Other hyperlipidemia: Secondary | ICD-10-CM | POA: Diagnosis not present

## 2021-05-04 DIAGNOSIS — M25511 Pain in right shoulder: Secondary | ICD-10-CM | POA: Diagnosis not present

## 2021-05-04 DIAGNOSIS — M47817 Spondylosis without myelopathy or radiculopathy, lumbosacral region: Secondary | ICD-10-CM | POA: Diagnosis not present

## 2021-05-04 DIAGNOSIS — Z79899 Other long term (current) drug therapy: Secondary | ICD-10-CM | POA: Diagnosis not present

## 2021-05-04 DIAGNOSIS — M545 Low back pain, unspecified: Secondary | ICD-10-CM | POA: Diagnosis not present

## 2021-05-04 DIAGNOSIS — M75120 Complete rotator cuff tear or rupture of unspecified shoulder, not specified as traumatic: Secondary | ICD-10-CM | POA: Diagnosis not present

## 2021-05-04 DIAGNOSIS — M4316 Spondylolisthesis, lumbar region: Secondary | ICD-10-CM | POA: Diagnosis not present

## 2021-05-06 ENCOUNTER — Ambulatory Visit: Payer: Medicare HMO

## 2021-05-13 DIAGNOSIS — M545 Low back pain, unspecified: Secondary | ICD-10-CM | POA: Diagnosis not present

## 2021-05-18 DIAGNOSIS — M545 Low back pain, unspecified: Secondary | ICD-10-CM | POA: Diagnosis not present

## 2021-05-20 DIAGNOSIS — M47817 Spondylosis without myelopathy or radiculopathy, lumbosacral region: Secondary | ICD-10-CM | POA: Diagnosis not present

## 2021-05-20 DIAGNOSIS — E119 Type 2 diabetes mellitus without complications: Secondary | ICD-10-CM | POA: Diagnosis not present

## 2021-05-22 ENCOUNTER — Ambulatory Visit: Payer: Medicare HMO

## 2021-05-24 DIAGNOSIS — J449 Chronic obstructive pulmonary disease, unspecified: Secondary | ICD-10-CM | POA: Diagnosis not present

## 2021-05-24 DIAGNOSIS — E119 Type 2 diabetes mellitus without complications: Secondary | ICD-10-CM | POA: Diagnosis not present

## 2021-05-24 DIAGNOSIS — I700 Atherosclerosis of aorta: Secondary | ICD-10-CM | POA: Diagnosis not present

## 2021-05-24 DIAGNOSIS — E7849 Other hyperlipidemia: Secondary | ICD-10-CM | POA: Diagnosis not present

## 2021-05-26 DIAGNOSIS — Z853 Personal history of malignant neoplasm of breast: Secondary | ICD-10-CM | POA: Diagnosis not present

## 2021-05-26 DIAGNOSIS — J449 Chronic obstructive pulmonary disease, unspecified: Secondary | ICD-10-CM | POA: Diagnosis not present

## 2021-06-02 DIAGNOSIS — M545 Low back pain, unspecified: Secondary | ICD-10-CM | POA: Diagnosis not present

## 2021-06-02 DIAGNOSIS — M75120 Complete rotator cuff tear or rupture of unspecified shoulder, not specified as traumatic: Secondary | ICD-10-CM | POA: Diagnosis not present

## 2021-06-02 DIAGNOSIS — M25511 Pain in right shoulder: Secondary | ICD-10-CM | POA: Diagnosis not present

## 2021-06-02 DIAGNOSIS — M4316 Spondylolisthesis, lumbar region: Secondary | ICD-10-CM | POA: Diagnosis not present

## 2021-06-02 DIAGNOSIS — Z79899 Other long term (current) drug therapy: Secondary | ICD-10-CM | POA: Diagnosis not present

## 2021-06-02 DIAGNOSIS — M47817 Spondylosis without myelopathy or radiculopathy, lumbosacral region: Secondary | ICD-10-CM | POA: Diagnosis not present

## 2021-06-04 DIAGNOSIS — M545 Low back pain, unspecified: Secondary | ICD-10-CM | POA: Diagnosis not present

## 2021-06-09 ENCOUNTER — Other Ambulatory Visit: Payer: Self-pay

## 2021-06-09 ENCOUNTER — Ambulatory Visit
Admission: RE | Admit: 2021-06-09 | Discharge: 2021-06-09 | Disposition: A | Discharge status: Home / Self Care | Payer: Medicare HMO | Source: Ambulatory Visit | Attending: Pulmonary Disease | Admitting: Pulmonary Disease

## 2021-06-09 DIAGNOSIS — J439 Emphysema, unspecified: Secondary | ICD-10-CM | POA: Diagnosis not present

## 2021-06-09 DIAGNOSIS — I700 Atherosclerosis of aorta: Secondary | ICD-10-CM | POA: Diagnosis not present

## 2021-06-09 DIAGNOSIS — R911 Solitary pulmonary nodule: Secondary | ICD-10-CM | POA: Diagnosis not present

## 2021-06-09 DIAGNOSIS — J449 Chronic obstructive pulmonary disease, unspecified: Secondary | ICD-10-CM | POA: Insufficient documentation

## 2021-06-24 DIAGNOSIS — E7849 Other hyperlipidemia: Secondary | ICD-10-CM | POA: Diagnosis not present

## 2021-06-24 DIAGNOSIS — E119 Type 2 diabetes mellitus without complications: Secondary | ICD-10-CM | POA: Diagnosis not present

## 2021-06-24 DIAGNOSIS — M47817 Spondylosis without myelopathy or radiculopathy, lumbosacral region: Secondary | ICD-10-CM | POA: Diagnosis not present

## 2021-06-24 DIAGNOSIS — J449 Chronic obstructive pulmonary disease, unspecified: Secondary | ICD-10-CM | POA: Diagnosis not present

## 2021-06-24 DIAGNOSIS — I700 Atherosclerosis of aorta: Secondary | ICD-10-CM | POA: Diagnosis not present

## 2021-06-30 DIAGNOSIS — M545 Low back pain, unspecified: Secondary | ICD-10-CM | POA: Diagnosis not present

## 2021-07-06 DIAGNOSIS — M545 Low back pain, unspecified: Secondary | ICD-10-CM | POA: Diagnosis not present

## 2021-07-06 DIAGNOSIS — M47817 Spondylosis without myelopathy or radiculopathy, lumbosacral region: Secondary | ICD-10-CM | POA: Diagnosis not present

## 2021-07-06 DIAGNOSIS — M75120 Complete rotator cuff tear or rupture of unspecified shoulder, not specified as traumatic: Secondary | ICD-10-CM | POA: Diagnosis not present

## 2021-07-06 DIAGNOSIS — G894 Chronic pain syndrome: Secondary | ICD-10-CM | POA: Diagnosis not present

## 2021-07-24 DIAGNOSIS — J449 Chronic obstructive pulmonary disease, unspecified: Secondary | ICD-10-CM | POA: Diagnosis not present

## 2021-07-27 DIAGNOSIS — I10 Essential (primary) hypertension: Secondary | ICD-10-CM | POA: Diagnosis not present

## 2021-07-27 DIAGNOSIS — Z23 Encounter for immunization: Secondary | ICD-10-CM | POA: Diagnosis not present

## 2021-08-12 DIAGNOSIS — M47817 Spondylosis without myelopathy or radiculopathy, lumbosacral region: Secondary | ICD-10-CM | POA: Diagnosis not present

## 2021-08-12 DIAGNOSIS — E119 Type 2 diabetes mellitus without complications: Secondary | ICD-10-CM | POA: Diagnosis not present

## 2021-12-11 ENCOUNTER — Other Ambulatory Visit: Payer: Self-pay | Admitting: General Surgery

## 2021-12-11 DIAGNOSIS — Z1239 Encounter for other screening for malignant neoplasm of breast: Secondary | ICD-10-CM

## 2021-12-22 ENCOUNTER — Inpatient Hospital Stay: Payer: Medicare HMO | Attending: Oncology | Admitting: Oncology

## 2021-12-22 ENCOUNTER — Encounter: Payer: Self-pay | Admitting: Oncology

## 2021-12-22 VITALS — BP 128/63 | HR 68 | Temp 96.3°F | Resp 18 | Wt 181.3 lb

## 2021-12-22 DIAGNOSIS — Z87891 Personal history of nicotine dependence: Secondary | ICD-10-CM | POA: Diagnosis not present

## 2021-12-22 DIAGNOSIS — Z923 Personal history of irradiation: Secondary | ICD-10-CM | POA: Diagnosis not present

## 2021-12-22 DIAGNOSIS — Z08 Encounter for follow-up examination after completed treatment for malignant neoplasm: Secondary | ICD-10-CM | POA: Diagnosis not present

## 2021-12-22 DIAGNOSIS — M8580 Other specified disorders of bone density and structure, unspecified site: Secondary | ICD-10-CM | POA: Insufficient documentation

## 2021-12-22 DIAGNOSIS — Z853 Personal history of malignant neoplasm of breast: Secondary | ICD-10-CM | POA: Insufficient documentation

## 2021-12-22 NOTE — Progress Notes (Signed)
? ? ? ?Hematology/Oncology Consult note ?[place]  ?[phone] [phone] ? ?Patient Care Team: ?Adin Hector, MD as PCP - General (Internal Medicine) ?Adin Hector, MD (Internal Medicine) ?Robert Bellow, MD (General Surgery)  ? ?Name of the patient: Sandra Burnett  ?[id number]  ?1946-02-29  ? ?Date of visit: 12/22/21 ? ?Diagnosis- stage I ER/PR HER-2/neu negative right breast cancer  ? ?Chief complaint/ Reason for visit-routine follow-up of breast cancer ? ?Heme/Onc history:  ?Oncology History Overview Note  ?2015- stage I (T1b N0 M0) ER/PR positive HER-2/neu-NEG invasive mammary carcinoma status post wide local excision; SLNBx [Dr.Byrnett] ?status post mammosite- November of 2015; Started on Femara in December of 2015. ? ?# SEP 2016- BMD- OSTEOPENIA; mammo-NEG  ? ?  ? ? ? ?Interval history- patient reports having worsening back pain for which she was seen at the pain clinic.  States that she also underwent MRI of her spine which did not show any evidence of malignancy.  She has undergone injections which helped her for about 3 months but the pain is slowly coming back.  She is doing relatively well/stable from a respiratory standpoint and has not had any recent infections or hospitalizations. ? ?ECOG PS- 2 ?Pain scale- 4 ? ? ?Review of systems- Review of Systems  ?Constitutional:  Positive for malaise/fatigue. Negative for chills, fever and weight loss.  ?HENT:  Negative for congestion, ear discharge and nosebleeds.   ?Eyes:  Negative for blurred vision.  ?Respiratory:  Positive for shortness of breath. Negative for cough, hemoptysis, sputum production and wheezing.   ?Cardiovascular:  Negative for chest pain, palpitations, orthopnea and claudication.  ?Gastrointestinal:  Negative for abdominal pain, blood in stool, constipation, diarrhea, heartburn, melena, nausea and vomiting.  ?Genitourinary:  Negative for dysuria, flank pain, frequency, hematuria and  urgency.  ?Musculoskeletal:  Positive for back pain. Negative for joint pain and myalgias.  ?Skin:  Negative for rash.  ?Neurological:  Negative for dizziness, tingling, focal weakness, seizures, weakness and headaches.  ?Endo/Heme/Allergies:  Does not bruise/bleed easily.  ?Psychiatric/Behavioral:  Negative for depression and suicidal ideas. The patient does not have insomnia.    ? ? ?Allergies  ?Allergen Reactions  ? Lisinopril Other (See Comments)  ?  Severe hypotension  ? Other Anaphylaxis and Other (See Comments)  ?  Yellow dye ?Elevated LFTs ?"Yellow Dye"  ? Codeine Nausea And Vomiting and Nausea Only  ?  Other reaction(s): Vomiting ?Other reaction(s): GI Intolerance  ? ? ? ?Past Medical History:  ?Diagnosis Date  ? Asthma   ? Breast cancer ([place]) right  ? 2015- mammosite   ? COPD (chronic obstructive pulmonary disease) ([place])   ? Diabetes mellitus without complication ([place])   ? Hyperlipidemia   ? Hypertension   ? Osteopenia   ? Osteoporosis   ? Personal history of radiation therapy 2015  ? RIGHT lumpectomy -Mammosite  ? Psoriasis   ? Screen for colon cancer 08/02/2017  ? negative cologuard  ? ? ? ?Past Surgical History:  ?Procedure Laterality Date  ? ABDOMINAL HYSTERECTOMY  1970's  ? partial  ? APPENDECTOMY    ? BREAST BIOPSY Bilateral 1980's  ? BREAST BIOPSY Right 2015  ?  INVASIVE MAMMARY CARCINOMA  ? BREAST LUMPECTOMY Right 2015  ?  INVASIVE MAMMARY CARCINOMA  ? BREAST SURGERY Right 06/13/2014  ? 8 mm ER+, PR+, Her 2 not over expressed, upper outer quadrant. Mammosite radiation.  ? carpel tunnel Bilateral 1984  ? CHOLECYSTECTOMY  1984  ?  COLONOSCOPY  2008  ? Dr Kristopher Glee  ? TUBAL LIGATION    ? ? ?Social History  ? ?Socioeconomic History  ? Marital status: Married  ?  Spouse name: Not on file  ? Number of children: Not on file  ? Years of education: Not on file  ? Highest education level: Not on file  ?Occupational History  ? Not on file  ?Tobacco Use  ? Smoking status: Former  ?  Packs/day: 1.00  ?  Years:  40.00  ?  Pack years: 40.00  ?  Types: Cigarettes  ? Smokeless tobacco: Never  ?Substance and Sexual Activity  ? Alcohol use: No  ? Drug use: No  ? Sexual activity: Not on file  ?Other Topics Concern  ? Not on file  ?Social History Narrative  ? Not on file  ? ?Social Determinants of Health  ? ?Financial Resource Strain: Not on file  ?Food Insecurity: Not on file  ?Transportation Needs: Not on file  ?Physical Activity: Not on file  ?Stress: Not on file  ?Social Connections: Not on file  ?Intimate Partner Violence: Not on file  ? ? ?Family History  ?Problem Relation Age of Onset  ? Pancreatitis Father   ? Cancer Paternal Grandmother   ?     breast  ? Breast cancer Paternal Grandmother   ? Breast cancer Paternal Aunt   ? ? ? ?Current Outpatient Medications:  ?  albuterol (VENTOLIN HFA) 108 (90 Base) MCG/ACT inhaler, , Disp: , Rfl:  ?  amLODipine (NORVASC) 5 MG tablet, Take 5 mg by mouth daily., Disp: , Rfl:  ?  azelastine (ASTELIN) 0.1 % nasal spray, azelastine 137 mcg (0.1 %) nasal spray aerosol  PLACE 1 SPRAY INTO BOTH NOSTRILS 2 (TWO) TIMES DAILY AS NEEDED FOR RHINITIS, Disp: , Rfl:  ?  Calcium Carbonate-Vit D-Min (CALTRATE 600+D PLUS PO), Take 1 tablet by mouth 3 (three) times a week. , Disp: , Rfl:  ?  clobetasol (TEMOVATE) 0.05 % external solution, USE 1 ML TWICE A DAY AS NEEDED. APPLY TO AFFECTED AREA ON SCALP. AVOID FACE/GROIN/AXILLA, Disp: , Rfl: 2 ?  EPINEPHrine 0.3 mg/0.3 mL IJ SOAJ injection, Inject 0.3 mg into the muscle once., Disp: , Rfl:  ?  fexofenadine (ALLEGRA) 180 MG tablet, Take 1 tablet by mouth as needed., Disp: , Rfl:  ?  Fluticasone-Salmeterol (ADVAIR) 100-50 MCG/DOSE AEPB, INHALE ONE PUFF 2 TIMES A DAY AS DIRECTED, Disp: , Rfl:  ?  hydrochlorothiazide (HYDRODIURIL) 25 MG tablet, Take 25 mg by mouth daily., Disp: , Rfl:  ?  lovastatin (MEVACOR) 40 MG tablet, Take 40 mg by mouth at bedtime., Disp: , Rfl:  ?  meloxicam (MOBIC) 15 MG tablet, , Disp: , Rfl:  ?  metFORMIN (GLUCOPHAGE) 500 MG tablet,  TAKE ONE TABLET BY MOUTH 2 TIMES A DAY, Disp: , Rfl:  ?  metoprolol tartrate (LOPRESSOR) 25 MG tablet, Take 25 mg by mouth 2 (two) times daily., Disp: , Rfl:  ?  Multiple Vitamin (MULTIVITAMIN) capsule, Take 1 capsule by mouth daily., Disp: , Rfl:  ?  Omega-3 Fatty Acids (FISH OIL) 1000 MG CAPS, Take by mouth daily., Disp: , Rfl:  ?  OXYGEN, Inhale 2 L into the lungs., Disp: , Rfl:  ?  tiotropium (SPIRIVA) 18 MCG inhalation capsule, Place 18 mcg into inhaler and inhale daily., Disp: , Rfl:  ?  venlafaxine XR (EFFEXOR-XR) 150 MG 24 hr capsule, Take 150 mg by mouth daily with breakfast., Disp: , Rfl:  ?  vitamin B-12 (CYANOCOBALAMIN) 100 MCG tablet, Take 100 mcg by mouth daily., Disp: , Rfl:  ?  aspirin EC 81 MG tablet, Take by mouth. (Patient not taking: Reported on 12/22/2021), Disp: , Rfl:  ?  pyridOXINE (VITAMIN B-6) 100 MG tablet, Take 100 mg by mouth daily. (Patient not taking: Reported on 12/22/2021), Disp: , Rfl:  ? ?Physical exam:  ?Vitals:  ? 12/22/21 1029  ?BP: 128/63  ?Pulse: 68  ?Resp: 18  ?Temp: (!) 96.3 ?F (35.7 ?C)  ?SpO2: 100%  ?Weight: 181 lb 4.8 oz (82.2 kg)  ? ?Physical Exam ?Constitutional:   ?   Comments: She is on home oxygen.  Appears in no acute distress  ?Cardiovascular:  ?   Rate and Rhythm: Normal rate and regular rhythm.  ?   Heart sounds: Normal heart sounds.  ?Pulmonary:  ?   Effort: Pulmonary effort is normal.  ?   Comments: Breath sounds decreased bilaterally diffusely ?Abdominal:  ?   General: Bowel sounds are normal.  ?   Palpations: Abdomen is soft.  ?Skin: ?   General: Skin is warm and dry.  ?Neurological:  ?   Mental Status: She is alert and oriented to person, place, and time.  ?  ? ? ?  Latest Ref Rng & Units 11/03/2017  ? 11:41 AM  ?CMP  ?Glucose 65 - 99 mg/dL 139    ?BUN 6 - 20 mg/dL 19    ?Creatinine 0.44 - 1.00 mg/dL 0.79    ?Sodium 135 - 145 mmol/L 137    ?Potassium 3.5 - 5.1 mmol/L 3.9    ?Chloride 101 - 111 mmol/L 102    ?CO2 22 - 32 mmol/L 26    ?Calcium 8.9 - 10.3 mg/dL 9.0     ?Total Protein 6.5 - 8.1 g/dL 7.2    ?Total Bilirubin 0.3 - 1.2 mg/dL 0.4    ?Alkaline Phos 38 - 126 U/L 77    ?AST 15 - 41 U/L 25    ?ALT 14 - 54 U/L 26    ? ? ?  Latest Ref Rng & Units 11/03/2017  ? 11:4

## 2022-02-05 ENCOUNTER — Ambulatory Visit
Admission: RE | Admit: 2022-02-05 | Discharge: 2022-02-05 | Disposition: A | Discharge status: Home / Self Care | Payer: Medicare HMO | Source: Ambulatory Visit | Attending: General Surgery | Admitting: General Surgery

## 2022-02-05 DIAGNOSIS — Z1239 Encounter for other screening for malignant neoplasm of breast: Secondary | ICD-10-CM

## 2022-02-05 DIAGNOSIS — Z1231 Encounter for screening mammogram for malignant neoplasm of breast: Secondary | ICD-10-CM | POA: Diagnosis present

## 2022-12-16 IMAGING — MG MM DIGITAL SCREENING BILAT W/ TOMO AND CAD
6 of 10 series · 6 of 30 positions shown · non-contrast
Comparison: Previous exam(s).

ACR Breast Density Category a: The breast tissue is almost entirely
fatty.

CLINICAL DATA: Screening.

EXAM:
DIGITAL SCREENING BILATERAL MAMMOGRAM WITH TOMOSYNTHESIS AND CAD
TECHNIQUE: Bilateral screening digital craniocaudal and mediolateral oblique
mammograms were obtained. Bilateral screening digital breast
tomosynthesis was performed. The images were evaluated with
computer-aided detection.

[L MLO synth-2D]
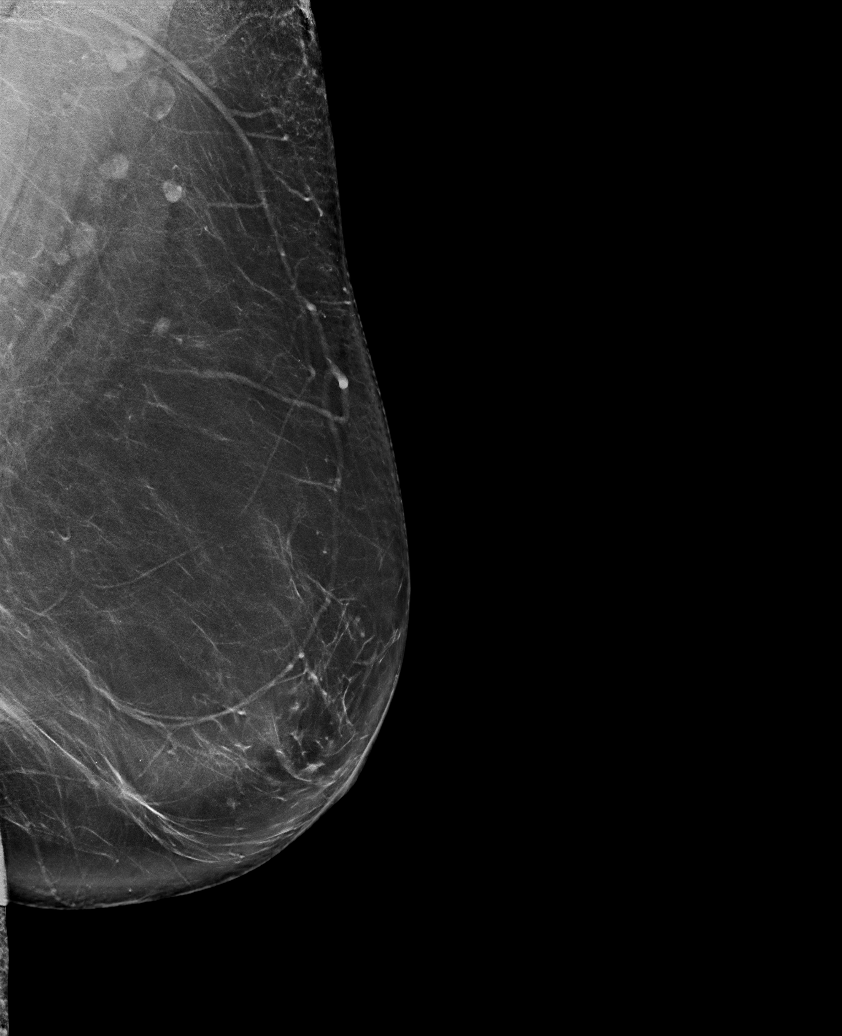

[R MLO synth-2D]
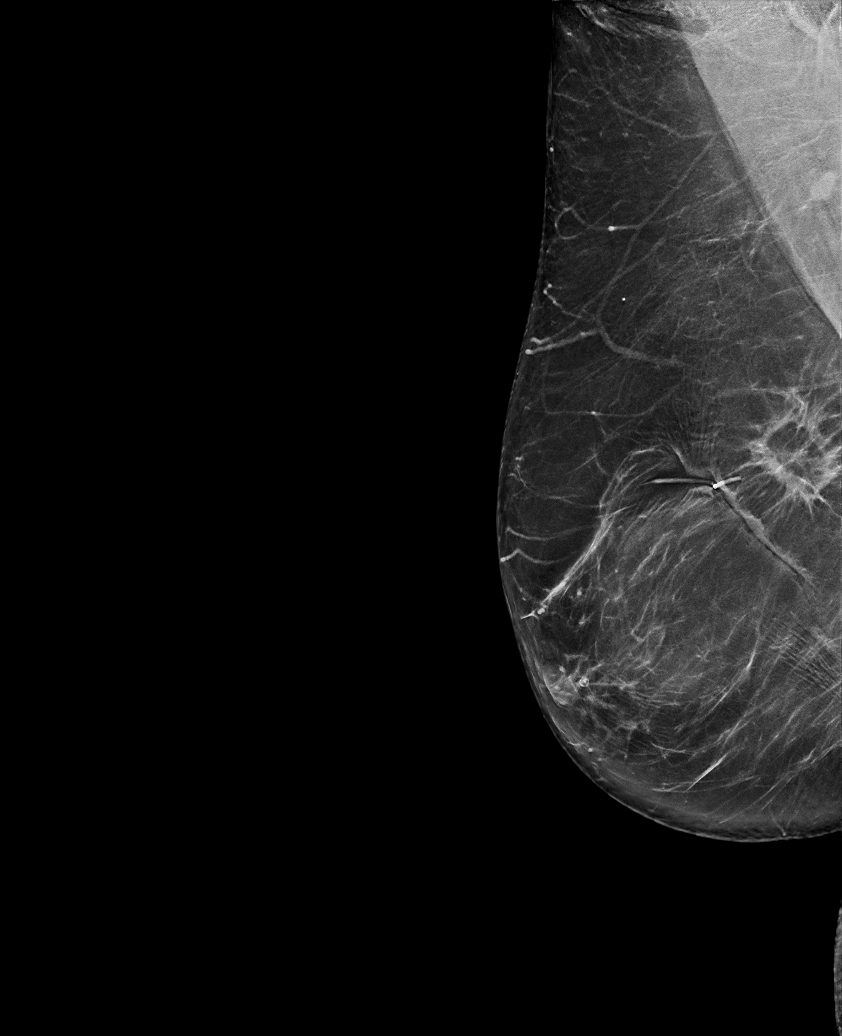

[R CC synth-2D]
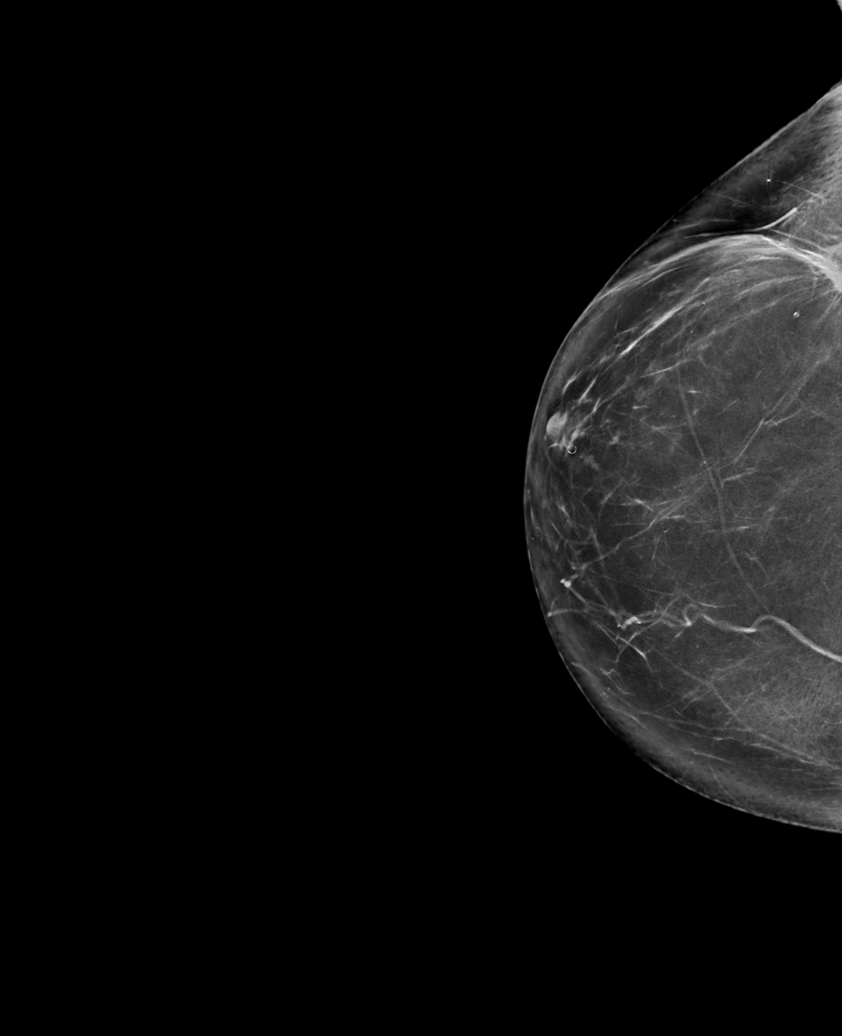

[L CC synth-2D]
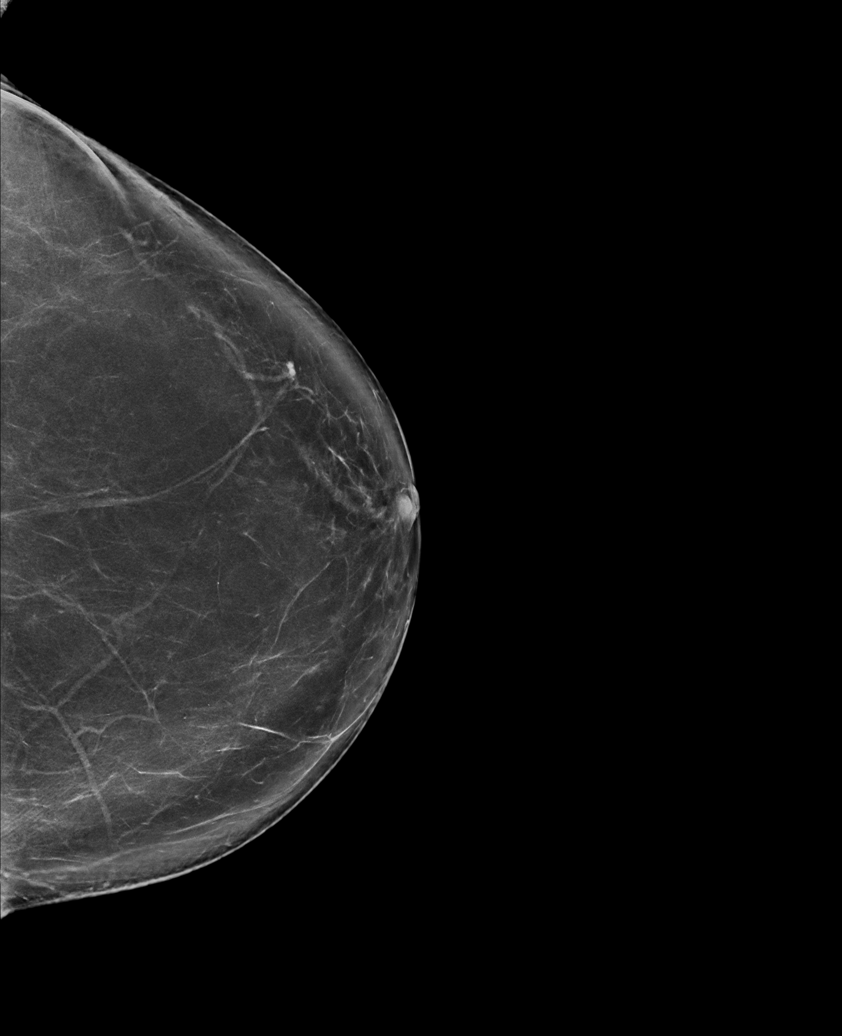

[R XCCL synth-2D]
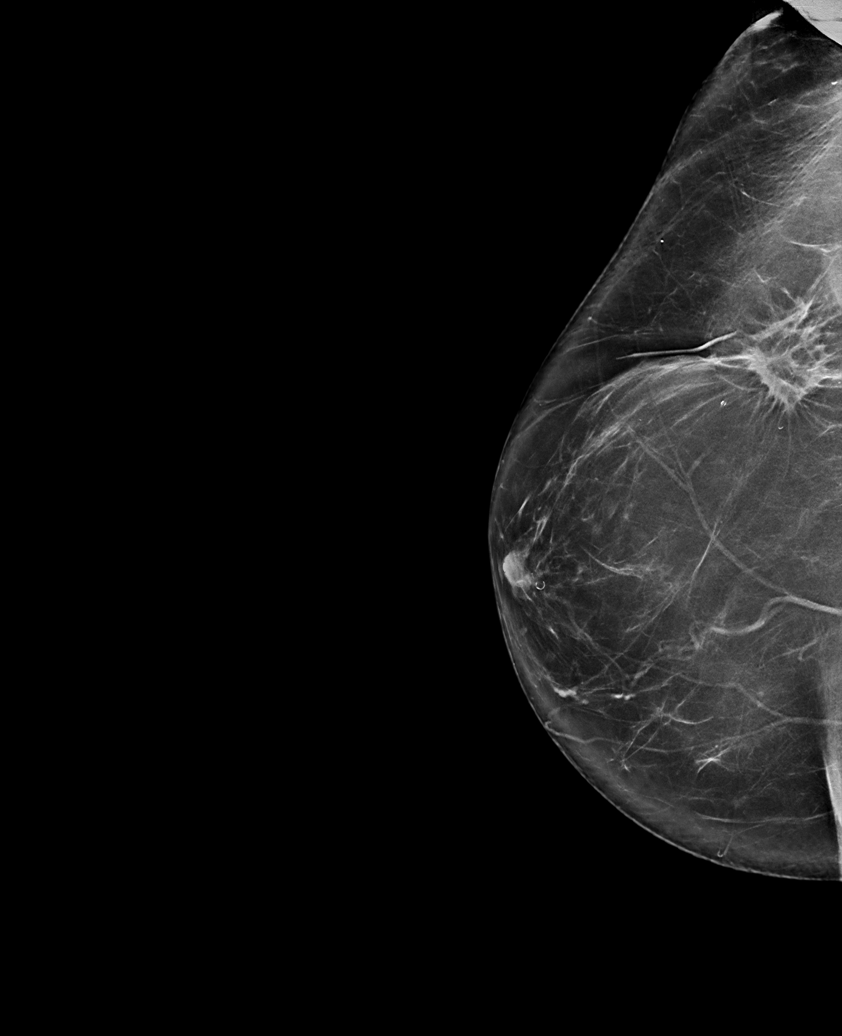

[R XCCL tomo · tomo slice 44/87.0]
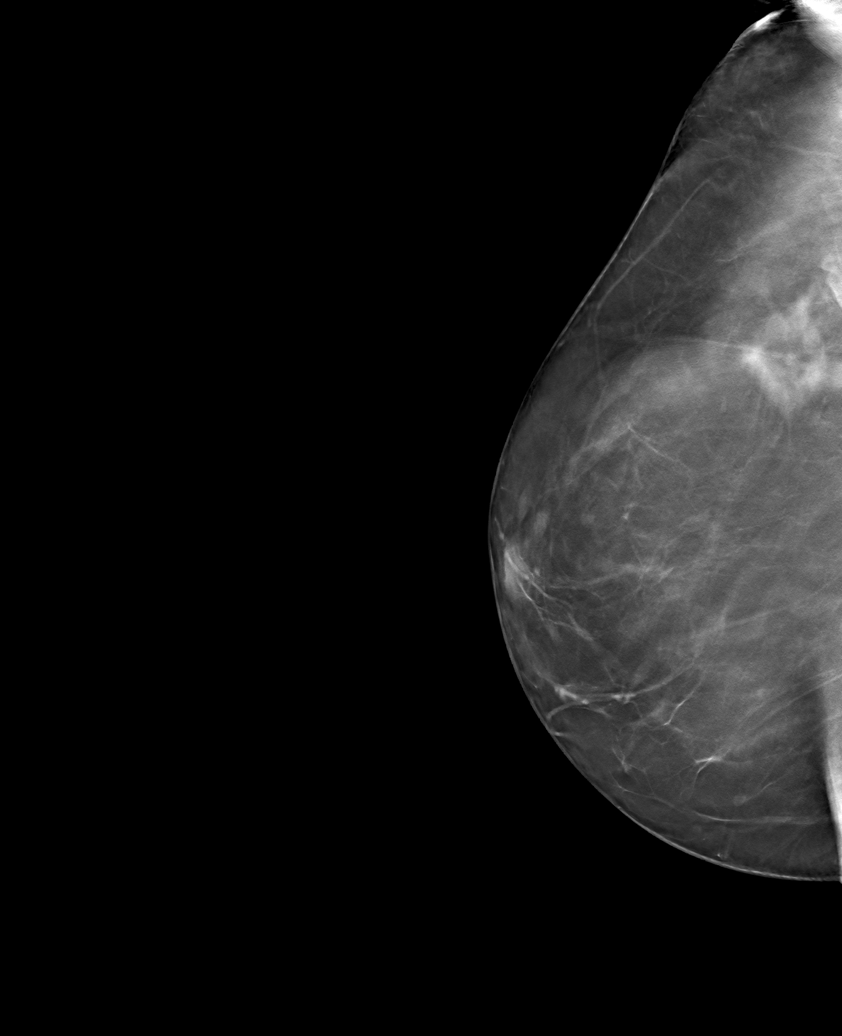

[6 of 30 positions shown; findings below may reference images not displayed]

FINDINGS: There are no findings suspicious for malignancy. The images were
evaluated with computer-aided detection.
IMPRESSION: No mammographic evidence of malignancy. A result letter of this
screening mammogram will be mailed directly to the patient.

RECOMMENDATION:
Screening mammogram in one year. (Code:JP-J-DD5)

BI-RADS CATEGORY  1: Negative.

## 2022-12-24 ENCOUNTER — Inpatient Hospital Stay: Payer: Medicare Other | Attending: Oncology | Admitting: Oncology

## 2022-12-24 VITALS — BP 135/56 | HR 63 | Temp 96.6°F | Resp 18 | Ht 65.0 in | Wt 190.4 lb

## 2022-12-24 DIAGNOSIS — Z08 Encounter for follow-up examination after completed treatment for malignant neoplasm: Secondary | ICD-10-CM

## 2022-12-24 DIAGNOSIS — Z853 Personal history of malignant neoplasm of breast: Secondary | ICD-10-CM | POA: Insufficient documentation

## 2022-12-27 ENCOUNTER — Encounter: Payer: Self-pay | Admitting: Oncology

## 2022-12-27 NOTE — Progress Notes (Signed)
Hematology/Oncology Consult note [facility]  [phone] [phone]  Patient Care Team: Lynnea Ferrier, MD as PCP - General (Internal Medicine) Lynnea Ferrier, MD (Internal Medicine) Lemar Livings Merrily Pew, MD (General Surgery)   Name of the patient: Sandra Burnett  [id number]  11/22/1945   Date of visit: 12/27/22  Diagnosis-  stage I ER/PR HER-2/neu negative right breast cancer     Chief complaint/ Reason for visit- routine f/u of breast cancer  Heme/Onc history:  Oncology History Overview Note  2015- stage I (T1b N0 M0) ER/PR positive HER-2/neu-NEG invasive mammary carcinoma status post wide local excision; SLNBx [Dr.Byrnett] status post mammosite- November of 2015; Started on Femara in December of 2015.  # SEP 2016- BMD- OSTEOPENIA; mammo-NEG    Patient stopped endocrine therapy in 2020   Interval history-she is doing well for her age.  She is on chronic home oxygen.  No recent hospitalizations.  Denies any breast concerns.  ECOG PS- 2 Pain scale- 0   Review of systems- Review of Systems  Constitutional:  Negative for chills, fever, malaise/fatigue and weight loss.  HENT:  Negative for congestion, ear discharge and nosebleeds.   Eyes:  Negative for blurred vision.  Respiratory:  Negative for cough, hemoptysis, sputum production, shortness of breath and wheezing.   Cardiovascular:  Negative for chest pain, palpitations, orthopnea and claudication.  Gastrointestinal:  Negative for abdominal pain, blood in stool, constipation, diarrhea, heartburn, melena, nausea and vomiting.  Genitourinary:  Negative for dysuria, flank pain, frequency, hematuria and urgency.  Musculoskeletal:  Negative for back pain, joint pain and myalgias.  Skin:  Negative for rash.  Neurological:  Negative for dizziness, tingling, focal weakness, seizures, weakness and headaches.  Endo/Heme/Allergies:  Does not bruise/bleed easily.   Psychiatric/Behavioral:  Negative for depression and suicidal ideas. The patient does not have insomnia.       Allergies  Allergen Reactions   Lisinopril Other (See Comments)    Severe hypotension   Other Anaphylaxis and Other (See Comments)    Yellow dye Elevated LFTs "Yellow Dye"   Codeine Nausea And Vomiting and Nausea Only    Other reaction(s): Vomiting Other reaction(s): GI Intolerance     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    COPD (chronic obstructive pulmonary disease) (HCC)    Diabetes mellitus without complication (HCC)    Hyperlipidemia    Hypertension    Osteopenia    Osteoporosis    Personal history of radiation therapy 2015   RIGHT lumpectomy -Mammosite   Psoriasis    Screen for colon cancer 08/02/2017   negative cologuard     Past Surgical History:  Procedure Laterality Date   ABDOMINAL HYSTERECTOMY  1970's   partial   APPENDECTOMY     BREAST BIOPSY Bilateral 1980's   BREAST BIOPSY Right 2015    INVASIVE MAMMARY CARCINOMA   BREAST LUMPECTOMY Right 2015    INVASIVE MAMMARY CARCINOMA   BREAST SURGERY Right 06/13/2014   8 mm ER+, PR+, Her 2 not over expressed, upper outer quadrant. Mammosite radiation.   carpel tunnel Bilateral 1984   CHOLECYSTECTOMY  1984   COLONOSCOPY  2008   Dr Nils Flack   TUBAL LIGATION      Social History   Socioeconomic History   Marital status: Married    Spouse name: Not on file   Number of children: Not on file   Years  of education: Not on file   Highest education level: Not on file  Occupational History   Not on file  Tobacco Use   Smoking status: Former    Packs/day: 1.00    Years: 40.00    Additional pack years: 0.00    Total pack years: 40.00    Types: Cigarettes   Smokeless tobacco: Never  Substance and Sexual Activity   Alcohol use: No   Drug use: No   Sexual activity: Not on file  Other Topics Concern   Not on file  Social History  Narrative   Not on file   Social Determinants of Health   Financial Resource Strain: Not on file  Food Insecurity: Not on file  Transportation Needs: Not on file  Physical Activity: Not on file  Stress: Not on file  Social Connections: Not on file  Intimate Partner Violence: Not on file    Family History  Problem Relation Age of Onset   Pancreatitis Father    Cancer Paternal Grandmother        breast   Breast cancer Paternal Grandmother    Breast cancer Paternal Aunt      Current Outpatient Medications:    albuterol (VENTOLIN HFA) 108 (90 Base) MCG/ACT inhaler, , Disp: , Rfl:    albuterol (VENTOLIN HFA) 108 (90 Base) MCG/ACT inhaler, Inhale into the lungs., Disp: , Rfl:    amLODipine (NORVASC) 5 MG tablet, Take 5 mg by mouth daily., Disp: , Rfl:    azelastine (ASTELIN) 0.1 % nasal spray, azelastine 137 mcg (0.1 %) nasal spray aerosol  PLACE 1 SPRAY INTO BOTH NOSTRILS 2 (TWO) TIMES DAILY AS NEEDED FOR RHINITIS, Disp: , Rfl:    Calcium Carbonate-Vit D-Min (CALTRATE 600+D PLUS PO), Take 1 tablet by mouth 3 (three) times a week. , Disp: , Rfl:    clobetasol (TEMOVATE) 0.05 % external solution, USE 1 ML TWICE A DAY AS NEEDED. APPLY TO AFFECTED AREA ON SCALP. AVOID FACE/GROIN/AXILLA, Disp: , Rfl: 2   EPINEPHrine 0.3 mg/0.3 mL IJ SOAJ injection, Inject 0.3 mg into the muscle once., Disp: , Rfl:    fexofenadine (ALLEGRA) 180 MG tablet, Take 1 tablet by mouth as needed., Disp: , Rfl:    Fluticasone-Salmeterol (ADVAIR) 100-50 MCG/DOSE AEPB, INHALE ONE PUFF 2 TIMES A DAY AS DIRECTED, Disp: , Rfl:    gabapentin (NEURONTIN) 100 MG capsule, Take by mouth., Disp: , Rfl:    hydrochlorothiazide (HYDRODIURIL) 25 MG tablet, Take 25 mg by mouth daily., Disp: , Rfl:    lovastatin (MEVACOR) 40 MG tablet, Take 1 tablet by mouth daily., Disp: , Rfl:    meloxicam (MOBIC) 15 MG tablet, , Disp: , Rfl:    metFORMIN (GLUCOPHAGE) 500 MG tablet, TAKE ONE TABLET BY MOUTH 2 TIMES A DAY, Disp: , Rfl:     metoprolol tartrate (LOPRESSOR) 25 MG tablet, Take 25 mg by mouth 2 (two) times daily., Disp: , Rfl:    montelukast (SINGULAIR) 10 MG tablet, Take 10 mg by mouth daily., Disp: , Rfl:    Multiple Vitamin (MULTIVITAMIN) capsule, Take 1 capsule by mouth daily., Disp: , Rfl:    Omega-3 Fatty Acids (FISH OIL) 1000 MG CAPS, Take by mouth daily., Disp: , Rfl:    OXYGEN, Inhale 2 L into the lungs., Disp: , Rfl:    tiotropium (SPIRIVA) 18 MCG inhalation capsule, Place 18 mcg into inhaler and inhale daily., Disp: , Rfl:    venlafaxine XR (EFFEXOR-XR) 150 MG 24 hr capsule, Take 150 mg by  mouth daily with breakfast., Disp: , Rfl:    vitamin B-12 (CYANOCOBALAMIN) 100 MCG tablet, Take 100 mcg by mouth daily., Disp: , Rfl:    aspirin EC 81 MG tablet, Take by mouth. (Patient not taking: Reported on 12/22/2021), Disp: , Rfl:    pyridOXINE (VITAMIN B-6) 100 MG tablet, Take 100 mg by mouth daily. (Patient not taking: Reported on 12/22/2021), Disp: , Rfl:   Physical exam:  Vitals:   12/24/22 1123 12/24/22 1130  BP: (!) 128/110 (!) 135/56  Pulse: 73 63  Resp: 18   Temp: (!) 96.6 F (35.9 C)   TempSrc: Tympanic   SpO2: 98% 100%  Weight: 190 lb 6.4 oz (86.4 kg)   Height: 5\' 5"  (1.651 m)    Physical Exam Cardiovascular:     Rate and Rhythm: Normal rate and regular rhythm.     Heart sounds: Normal heart sounds.  Pulmonary:     Effort: Pulmonary effort is normal.     Breath sounds: Normal breath sounds.  Abdominal:     General: Bowel sounds are normal.     Palpations: Abdomen is soft.  Skin:    General: Skin is warm and dry.  Neurological:     Mental Status: She is alert and oriented to person, place, and time.    Breast exam was performed in seated and lying down position. Patient is status post right lumpectomy with a well-healed surgical scar. No evidence of any palpable masses. No evidence of axillary adenopathy. No evidence of any palpable masses or lumps in the left breast. No evidence of leftt  axillary adenopathy      Latest Ref Rng & Units 11/03/2017   11:41 AM  CMP  Glucose 65 - 99 mg/dL 098   BUN 6 - 20 mg/dL 19   Creatinine 1.19 - 1.00 mg/dL 1.47   Sodium 829 - 562 mmol/L 137   Potassium 3.5 - 5.1 mmol/L 3.9   Chloride 101 - 111 mmol/L 102   CO2 22 - 32 mmol/L 26   Calcium 8.9 - 10.3 mg/dL 9.0   Total Protein 6.5 - 8.1 g/dL 7.2   Total Bilirubin 0.3 - 1.2 mg/dL 0.4   Alkaline Phos 38 - 126 U/L 77   AST 15 - 41 U/L 25   ALT 14 - 54 U/L 26       Latest Ref Rng & Units 11/03/2017   11:41 AM  CBC  WBC 3.6 - 11.0 K/uL 6.4   Hemoglobin 12.0 - 16.0 g/dL 13.0   Hematocrit 86.5 - 47.0 % 40.4   Platelets 150 - 440 K/uL 239      Assessment and plan- Patient is a 77 y.o. female with history of stage I ER/PR positive HER2 negative right breast cancer s/p surgery and 5 years of endocrine therapy.  She is here for routine follow-up  Patient is now 9 years out of her breast cancer diagnosis.  Clinically she is doing well with noSigns and symptoms of recurrent based on today's exam.  She will need a mammogram in Jillaine 2024 which I will schedule.  I will see her back in 1 year   Visit Diagnosis 1. Encounter for follow-up surveillance of breast cancer      Dr. Owens Shark, MD, MPH [facility] [phone] 12/27/2022 10:43 AM

## 2023-04-20 ENCOUNTER — Encounter: Payer: Self-pay | Admitting: Internal Medicine

## 2023-04-26 ENCOUNTER — Other Ambulatory Visit (HOSPITAL_COMMUNITY): Payer: Self-pay | Admitting: Internal Medicine

## 2023-04-26 DIAGNOSIS — R079 Chest pain, unspecified: Secondary | ICD-10-CM

## 2023-05-04 ENCOUNTER — Telehealth (HOSPITAL_COMMUNITY): Payer: Self-pay | Admitting: *Deleted

## 2023-05-04 ENCOUNTER — Telehealth (HOSPITAL_COMMUNITY): Payer: Self-pay | Admitting: Emergency Medicine

## 2023-05-04 NOTE — Telephone Encounter (Signed)
Attempted to call patient regarding upcoming cardiac CT appointment. °Left message on voicemail with name and callback number °Dwan Fennel RN Navigator Cardiac Imaging °[facility] °[phone] Office °[phone] Cell ° °

## 2023-05-04 NOTE — Telephone Encounter (Signed)
Patient returning call upcoming cardiac imaging study; pt verbalizes understanding of appt date/time, parking situation and where to check in, pre-test NPO status and medications ordered, and verified current allergies; name and call back number provided for further questions should they arise  Larey Brick RN Navigator Cardiac Imaging [facility] [phone] office [phone] cell  Patient to take 100mg  metoprolol tartrate two hours prior to her cardiac CT scan.

## 2023-05-05 ENCOUNTER — Ambulatory Visit
Admission: RE | Admit: 2023-05-05 | Discharge: 2023-05-05 | Disposition: A | Discharge status: Home / Self Care | Payer: Medicare Other | Source: Ambulatory Visit | Attending: Internal Medicine | Admitting: Internal Medicine

## 2023-05-05 DIAGNOSIS — I700 Atherosclerosis of aorta: Secondary | ICD-10-CM | POA: Insufficient documentation

## 2023-05-05 DIAGNOSIS — R079 Chest pain, unspecified: Secondary | ICD-10-CM | POA: Diagnosis present

## 2023-05-05 DIAGNOSIS — I2510 Atherosclerotic heart disease of native coronary artery without angina pectoris: Secondary | ICD-10-CM | POA: Diagnosis not present

## 2023-05-05 DIAGNOSIS — E119 Type 2 diabetes mellitus without complications: Secondary | ICD-10-CM | POA: Insufficient documentation

## 2023-05-05 DIAGNOSIS — E7849 Other hyperlipidemia: Secondary | ICD-10-CM | POA: Diagnosis not present

## 2023-05-05 DIAGNOSIS — I10 Essential (primary) hypertension: Secondary | ICD-10-CM | POA: Diagnosis not present

## 2023-05-05 MED ORDER — SODIUM CHLORIDE 0.9 % IV SOLN: INTRAVENOUS | Status: DC

## 2023-05-05 MED ORDER — NITROGLYCERIN 0.4 MG SL SUBL
0.8000 mg | SUBLINGUAL_TABLET | Freq: Once | SUBLINGUAL | Status: AC
  Administered 2023-05-05: 0.8 mg via SUBLINGUAL

## 2023-05-05 MED ORDER — DILTIAZEM HCL 25 MG/5ML IV SOLN
10.0000 mg | INTRAVENOUS | Status: DC | PRN
  Administered 2023-05-05: 10 mg via INTRAVENOUS

## 2023-05-05 MED ORDER — IOHEXOL 350 MG/ML SOLN
100.0000 mL | Freq: Once | INTRAVENOUS | Status: AC | PRN
  Administered 2023-05-05: 100 mL via INTRAVENOUS

## 2023-05-05 MED ORDER — DILTIAZEM HCL 25 MG/5ML IV SOLN
5.0000 mg | Freq: Once | INTRAVENOUS | Status: AC
  Administered 2023-05-05: 5 mg via INTRAVENOUS

## 2023-05-05 NOTE — Progress Notes (Signed)
Patient tolerated procedure well. Ambulate w/o difficulty. Denies light headedness or being dizzy. Drinking water provided. Encouraged to drink extra water today and reasoning explained. Verbalized understanding. All questions answered. ABC intact. No further needs. Discharge from procedure area w/o issues.

## 2023-10-21 DIAGNOSIS — E119 Type 2 diabetes mellitus without complications: Secondary | ICD-10-CM | POA: Diagnosis not present

## 2023-10-21 DIAGNOSIS — E538 Deficiency of other specified B group vitamins: Secondary | ICD-10-CM | POA: Diagnosis not present

## 2023-10-21 DIAGNOSIS — I700 Atherosclerosis of aorta: Secondary | ICD-10-CM | POA: Diagnosis not present

## 2023-10-21 DIAGNOSIS — J449 Chronic obstructive pulmonary disease, unspecified: Secondary | ICD-10-CM | POA: Diagnosis not present

## 2023-10-21 DIAGNOSIS — F32A Depression, unspecified: Secondary | ICD-10-CM | POA: Diagnosis not present

## 2023-10-21 DIAGNOSIS — Z853 Personal history of malignant neoplasm of breast: Secondary | ICD-10-CM | POA: Diagnosis not present

## 2023-10-21 DIAGNOSIS — I10 Essential (primary) hypertension: Secondary | ICD-10-CM | POA: Diagnosis not present

## 2023-10-21 DIAGNOSIS — E7849 Other hyperlipidemia: Secondary | ICD-10-CM | POA: Diagnosis not present

## 2023-10-21 DIAGNOSIS — L409 Psoriasis, unspecified: Secondary | ICD-10-CM | POA: Diagnosis not present

## 2023-10-21 DIAGNOSIS — J9611 Chronic respiratory failure with hypoxia: Secondary | ICD-10-CM | POA: Diagnosis not present

## 2023-12-23 ENCOUNTER — Encounter: Payer: Self-pay | Admitting: Oncology

## 2023-12-23 ENCOUNTER — Inpatient Hospital Stay: Payer: Medicare Other | Attending: Oncology | Admitting: Oncology

## 2024-05-10 ENCOUNTER — Other Ambulatory Visit: Payer: Self-pay | Admitting: Internal Medicine

## 2024-05-10 DIAGNOSIS — Z1231 Encounter for screening mammogram for malignant neoplasm of breast: Secondary | ICD-10-CM

## 2024-05-23 ENCOUNTER — Inpatient Hospital Stay
Admission: RE | Admit: 2024-05-23 | Discharge: 2024-05-23 | Disposition: A | Discharge status: Home / Self Care | Payer: Self-pay | Source: Ambulatory Visit | Attending: Physician Assistant | Admitting: Physician Assistant

## 2024-05-23 ENCOUNTER — Other Ambulatory Visit: Payer: Self-pay | Admitting: Family Medicine

## 2024-05-23 DIAGNOSIS — Z049 Encounter for examination and observation for unspecified reason: Secondary | ICD-10-CM

## 2024-05-30 NOTE — Progress Notes (Deleted)
 Referring Physician:  Fernande Ophelia JINNY DOUGLAS, MD [address] [facility] [place],  [place] [address]  Primary Physician:  Fernande Ophelia JINNY DOUGLAS, MD  History of Present Illness: 05/30/2024 Sandra Burnett is here today with a chief complaint of ***  Low back pain Numbness or tingling in the legs? Leg pain? Has numbness in fingers  Duration: *** Location: *** Quality: *** Severity: ***  Precipitating: aggravated by *** Modifying factors: made better by *** Weakness: none Timing: *** Bowel/Bladder Dysfunction: none  Conservative measures: yoga Physical therapy: *** has not participated in Multimodal medical therapy including regular antiinflammatories: *** Gabapentin, Meloxicam, Tylenol Injections: *** no epidural steroid injections  Past Surgery: ***no spine surgery  [name] has ***no symptoms of cervical myelopathy.  The symptoms are causing a significant impact on the patient's life.   Review of Systems:  A 10 point review of systems is negative, except for the pertinent positives and negatives detailed in the HPI.  Past Medical History: 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    COPD (chronic obstructive pulmonary disease) (HCC)    Diabetes mellitus without complication (HCC)    Hyperlipidemia    Hypertension    Osteopenia    Osteoporosis    Personal history of radiation therapy 2015   RIGHT lumpectomy -Mammosite   Psoriasis    Screen for colon cancer 08/02/2017   negative cologuard    Past Surgical History: Past Surgical History:  Procedure Laterality Date   ABDOMINAL HYSTERECTOMY  1970's   partial   APPENDECTOMY     BREAST BIOPSY Bilateral 1980's   BREAST BIOPSY Right 2015    INVASIVE MAMMARY CARCINOMA   BREAST LUMPECTOMY Right 2015    INVASIVE MAMMARY CARCINOMA   BREAST SURGERY Right 06/13/2014   8 mm ER+, PR+, Her 2 not over expressed, upper outer quadrant. Mammosite  radiation.   carpel tunnel Bilateral 1984   CHOLECYSTECTOMY  1984   COLONOSCOPY  2008   Dr Guss   TUBAL LIGATION      Allergies: Allergies as of 06/05/2024 - Review Complete 05/05/2023  Allergen Reaction Noted   Lisinopril Other (See Comments) 02/21/2014   Other Anaphylaxis and Other (See Comments) 02/21/2014   Codeine Nausea And Vomiting and Nausea Only 02/21/2014    Medications: Outpatient Encounter Medications as of 06/05/2024  Medication Sig   albuterol (VENTOLIN HFA) 108 (90 Base) MCG/ACT inhaler    albuterol (VENTOLIN HFA) 108 (90 Base) MCG/ACT inhaler Inhale into the lungs.   amLODipine (NORVASC) 5 MG tablet Take 5 mg by mouth daily.   aspirin EC 81 MG tablet Take by mouth. (Patient not taking: Reported on 12/22/2021)   azelastine (ASTELIN) 0.1 % nasal spray azelastine 137 mcg (0.1 %) nasal spray aerosol  PLACE 1 SPRAY INTO BOTH NOSTRILS 2 (TWO) TIMES DAILY AS NEEDED FOR RHINITIS   Calcium Carbonate-Vit D-Min (CALTRATE 600+D PLUS PO) Take 1 tablet by mouth 3 (three) times a week.    clobetasol (TEMOVATE) 0.05 % external solution USE 1 ML TWICE A DAY AS NEEDED. APPLY TO AFFECTED AREA ON SCALP. AVOID FACE/GROIN/AXILLA   EPINEPHrine 0.3 mg/0.3 mL IJ SOAJ injection Inject 0.3 mg into the muscle once.   fexofenadine (ALLEGRA) 180 MG tablet Take 1 tablet by mouth as needed.   Fluticasone-Salmeterol (ADVAIR) 100-50 MCG/DOSE AEPB INHALE ONE PUFF 2 TIMES A DAY AS DIRECTED   gabapentin (NEURONTIN) 100 MG capsule Take  by mouth.   hydrochlorothiazide (HYDRODIURIL) 25 MG tablet Take 25 mg by mouth daily.   lovastatin (MEVACOR) 40 MG tablet Take 1 tablet by mouth daily.   meloxicam (MOBIC) 15 MG tablet    metFORMIN (GLUCOPHAGE) 500 MG tablet TAKE ONE TABLET BY MOUTH 2 TIMES A DAY   metoprolol tartrate (LOPRESSOR) 25 MG tablet Take 25 mg by mouth 2 (two) times daily.   montelukast (SINGULAIR) 10 MG tablet Take 10 mg by mouth daily.   Multiple Vitamin (MULTIVITAMIN) capsule Take 1 capsule  by mouth daily.   Omega-3 Fatty Acids (FISH OIL) 1000 MG CAPS Take by mouth daily.   OXYGEN  Inhale 2 L into the lungs.   pyridOXINE (VITAMIN B-6) 100 MG tablet Take 100 mg by mouth daily. (Patient not taking: Reported on 12/22/2021)   tiotropium (SPIRIVA) 18 MCG inhalation capsule Place 18 mcg into inhaler and inhale daily.   venlafaxine XR (EFFEXOR-XR) 150 MG 24 hr capsule Take 150 mg by mouth daily with breakfast.   vitamin B-12 (CYANOCOBALAMIN ) 100 MCG tablet Take 100 mcg by mouth daily.   No facility-administered encounter medications on file as of 06/05/2024.    Social History: Social History   Tobacco Use   Smoking status: Former    Current packs/day: 1.00    Average packs/day: 1 pack/day for 40.0 years (40.0 ttl pk-yrs)    Types: Cigarettes   Smokeless tobacco: Never  Substance Use Topics   Alcohol use: No   Drug use: No    Family Medical History: Family History  Problem Relation Age of Onset   Pancreatitis Father    Cancer Paternal Grandmother        breast   Breast cancer Paternal Grandmother    Breast cancer Paternal Aunt     Physical Examination: @VITALWITHPAIN @  General: Patient is well developed, well nourished, calm, collected, and in no apparent distress. Attention to examination is appropriate.  Psychiatric: Patient is non-anxious.  Head:  Pupils equal, round, and reactive to light.  ENT:  Oral mucosa appears well hydrated.  Neck:   Supple.  ***Full range of motion.  Respiratory: Patient is breathing without any difficulty.  Extremities: No edema.  Vascular: Palpable dorsal pedal pulses.  Skin:   On exposed skin, there are no abnormal skin lesions.  NEUROLOGICAL:     Awake, alert, oriented to person, place, and time.  Speech is clear and fluent. Fund of knowledge is appropriate.   Cranial Nerves: Pupils equal round and reactive to light.  Facial tone is symmetric.  Facial sensation is symmetric.  ROM of spine: ***full.  Palpation of spine:  ***non tender.    Strength: Side Biceps Triceps Deltoid Interossei Grip Wrist Ext. Wrist Flex.  R 5 5 5 5 5 5 5   L 5 5 5 5 5 5 5    Side Iliopsoas Quads Hamstring PF DF EHL  R 5 5 5 5 5 5   L 5 5 5 5 5 5    Reflexes are ***2+ and symmetric at the biceps, triceps, brachioradialis, patella and achilles.   Hoffman's is absent.  Clonus is not present.  Toes are down-going.  Bilateral upper and lower extremity sensation is intact to light touch.    Gait is normal.   No difficulty with tandem gait.   No evidence of dysmetria noted.  Medical Decision Making  Imaging: ***  I have personally reviewed the images and agree with the above interpretation.  Assessment and Plan: Ms. Wyman is a pleasant 78 y.o. female  with ***    Thank you for involving me in the care of this patient.   I spent a total of *** minutes in both face-to-face and non-face-to-face activities for this visit on the date of this encounter.   Lyle Decamp, PA-C Dept. of Neurosurgery

## 2024-06-05 ENCOUNTER — Ambulatory Visit: Admitting: Physician Assistant

## 2024-06-05 ENCOUNTER — Encounter

## 2024-06-29 ENCOUNTER — Emergency Department (HOSPITAL_COMMUNITY)

## 2024-06-29 ENCOUNTER — Other Ambulatory Visit: Payer: Self-pay

## 2024-06-29 ENCOUNTER — Observation Stay (HOSPITAL_COMMUNITY)
Admission: EM | Admit: 2024-06-29 | Discharge: 2024-07-01 | Disposition: A | Attending: Internal Medicine | Admitting: Internal Medicine

## 2024-06-29 DIAGNOSIS — E66811 Obesity, class 1: Secondary | ICD-10-CM | POA: Insufficient documentation

## 2024-06-29 DIAGNOSIS — Z853 Personal history of malignant neoplasm of breast: Secondary | ICD-10-CM | POA: Diagnosis not present

## 2024-06-29 DIAGNOSIS — Z6831 Body mass index (BMI) 31.0-31.9, adult: Secondary | ICD-10-CM | POA: Diagnosis not present

## 2024-06-29 DIAGNOSIS — Z87891 Personal history of nicotine dependence: Secondary | ICD-10-CM | POA: Diagnosis not present

## 2024-06-29 DIAGNOSIS — I10 Essential (primary) hypertension: Secondary | ICD-10-CM | POA: Diagnosis not present

## 2024-06-29 DIAGNOSIS — Z23 Encounter for immunization: Secondary | ICD-10-CM | POA: Insufficient documentation

## 2024-06-29 DIAGNOSIS — J9611 Chronic respiratory failure with hypoxia: Secondary | ICD-10-CM | POA: Diagnosis not present

## 2024-06-29 DIAGNOSIS — R079 Chest pain, unspecified: Secondary | ICD-10-CM

## 2024-06-29 DIAGNOSIS — L409 Psoriasis, unspecified: Secondary | ICD-10-CM | POA: Diagnosis present

## 2024-06-29 DIAGNOSIS — E119 Type 2 diabetes mellitus without complications: Secondary | ICD-10-CM | POA: Diagnosis not present

## 2024-06-29 DIAGNOSIS — R55 Syncope and collapse: Secondary | ICD-10-CM | POA: Diagnosis not present

## 2024-06-29 DIAGNOSIS — J449 Chronic obstructive pulmonary disease, unspecified: Secondary | ICD-10-CM | POA: Diagnosis not present

## 2024-06-29 DIAGNOSIS — Z7982 Long term (current) use of aspirin: Secondary | ICD-10-CM | POA: Diagnosis not present

## 2024-06-29 DIAGNOSIS — J45909 Unspecified asthma, uncomplicated: Secondary | ICD-10-CM

## 2024-06-29 DIAGNOSIS — Z79899 Other long term (current) drug therapy: Secondary | ICD-10-CM | POA: Diagnosis not present

## 2024-06-29 DIAGNOSIS — J4489 Other specified chronic obstructive pulmonary disease: Secondary | ICD-10-CM | POA: Diagnosis present

## 2024-06-29 DIAGNOSIS — Z5329 Procedure and treatment not carried out because of patient's decision for other reasons: Secondary | ICD-10-CM

## 2024-06-29 LAB — CBC WITH DIFFERENTIAL/PLATELET
Abs Immature Granulocytes: 0.02 K/uL (ref 0.00–0.07)
Basophils Absolute: 0 K/uL (ref 0.0–0.1)
Basophils Relative: 1 %
Eosinophils Absolute: 0.2 K/uL (ref 0.0–0.5)
Eosinophils Relative: 3 %
HCT: 35.8 % — ABNORMAL LOW (ref 36.0–46.0)
Hemoglobin: 11.6 g/dL — ABNORMAL LOW (ref 12.0–15.0)
Immature Granulocytes: 0 %
Lymphocytes Relative: 22 %
Lymphs Abs: 1.4 K/uL (ref 0.7–4.0)
MCH: 32.1 pg (ref 26.0–34.0)
MCHC: 32.4 g/dL (ref 30.0–36.0)
MCV: 99.2 fL (ref 80.0–100.0)
Monocytes Absolute: 0.5 K/uL (ref 0.1–1.0)
Monocytes Relative: 9 %
Neutro Abs: 4.1 K/uL (ref 1.7–7.7)
Neutrophils Relative %: 65 %
Platelets: 261 K/uL (ref 150–400)
RBC: 3.61 MIL/uL — ABNORMAL LOW (ref 3.87–5.11)
RDW: 12.7 % (ref 11.5–15.5)
WBC: 6.3 K/uL (ref 4.0–10.5)
nRBC: 0 % (ref 0.0–0.2)

## 2024-06-29 LAB — COMPREHENSIVE METABOLIC PANEL WITH GFR
ALT: 28 U/L (ref 0–44)
AST: 27 U/L (ref 15–41)
Albumin: 3.8 g/dL (ref 3.5–5.0)
Alkaline Phosphatase: 70 U/L (ref 38–126)
Anion gap: 13 (ref 5–15)
BUN: 11 mg/dL (ref 8–23)
CO2: 28 mmol/L (ref 22–32)
Calcium: 9.1 mg/dL (ref 8.9–10.3)
Chloride: 101 mmol/L (ref 98–111)
Creatinine, Ser: 0.84 mg/dL (ref 0.44–1.00)
GFR, Estimated: >60 mL/min (ref >60)
Glucose, Bld: 143 mg/dL — ABNORMAL HIGH (ref 70–99)
Potassium: 3.5 mmol/L (ref 3.5–5.1)
Sodium: 142 mmol/L (ref 135–145)
Total Bilirubin: 0.3 mg/dL (ref 0.0–1.2)
Total Protein: 7.5 g/dL (ref 6.5–8.1)

## 2024-06-29 LAB — I-STAT CHEM 8, ED
BUN: 14 mg/dL (ref 8–23)
Calcium, Ion: 1.15 mmol/L (ref 1.15–1.40)
Chloride: 102 mmol/L (ref 98–111)
Creatinine, Ser: 1 mg/dL (ref 0.44–1.00)
Glucose, Bld: 147 mg/dL — ABNORMAL HIGH (ref 70–99)
HCT: 35 % — ABNORMAL LOW (ref 36.0–46.0)
Hemoglobin: 11.9 g/dL — ABNORMAL LOW (ref 12.0–15.0)
Potassium: 3.3 mmol/L — ABNORMAL LOW (ref 3.5–5.1)
Sodium: 142 mmol/L (ref 135–145)
TCO2: 31 mmol/L (ref 22–32)

## 2024-06-29 LAB — TROPONIN I (HIGH SENSITIVITY)
Troponin I (High Sensitivity): 5 ng/L (ref <18)
Troponin I (High Sensitivity): 8 ng/L (ref <18)

## 2024-06-29 MED ORDER — NITROGLYCERIN 0.4 MG SL SUBL
0.4000 mg | SUBLINGUAL_TABLET | SUBLINGUAL | Status: DC | PRN
  Administered 2024-06-29: 0.4 mg via SUBLINGUAL
  Filled 2024-06-29: qty 1

## 2024-06-29 MED ORDER — IPRATROPIUM-ALBUTEROL 0.5-2.5 (3) MG/3ML IN SOLN: 3.0000 mL | Freq: Once | RESPIRATORY_TRACT | Status: DC

## 2024-06-29 MED ORDER — ONDANSETRON HCL 4 MG/2ML IJ SOLN: 4.0000 mg | Freq: Once | INTRAMUSCULAR | Status: DC

## 2024-06-29 MED ORDER — OXYCODONE HCL 5 MG PO TABS
5.0000 mg | ORAL_TABLET | Freq: Once | ORAL | Status: AC
  Administered 2024-06-29: 5 mg via ORAL
  Filled 2024-06-29: qty 1

## 2024-06-29 MED ORDER — ACETAMINOPHEN 500 MG PO TABS
1000.0000 mg | ORAL_TABLET | Freq: Once | ORAL | Status: AC
  Administered 2024-06-29: 1000 mg via ORAL
  Filled 2024-06-29: qty 2

## 2024-06-29 MED ORDER — IOHEXOL 350 MG/ML SOLN
75.0000 mL | Freq: Once | INTRAVENOUS | Status: AC | PRN
  Administered 2024-06-29: 75 mL via INTRAVENOUS

## 2024-06-29 NOTE — ED Notes (Signed)
 While in another patient's room, this patient left out of her room with family saying they wanted to leave AMA. IV taken out by other RN and patient left without speaking to any providers.
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
## 2024-06-29 NOTE — ED Notes (Signed)
 Patient arrived back to room with son and has decided to stay in the hospital.

## 2024-06-29 NOTE — H&P (Incomplete)
 History and Physical    Maryetta H Castellanos FMW:[id number] DOB: 12-31-1945 DOA: 06/29/2024  PCP: Fernande Ophelia JINNY DOUGLAS, MD  Patient coming from: home  I have personally briefly reviewed patient's old medical records in [facility]  Chief Complaint:  chest pain with radiation to R shoulder   HPI: Sandra Burnett is a 78 y.o. female with medical history significant of Asthma, COPD O2 dependent, DMII, HTN , HLD  who presents to ED with with complaint of chest pain that began around 3pm day of presentation. Patient notes pain  described as substernal  aching pressure, with radiation to her shoulders that is associated with DOE and nausea.. Patient course further complicated by episode of syncope en route with ambulance. Per EMS not seizure like activity, no change in pulse rate or blood pressure.  Patient denies any prodrome. NO fever/chills/ cough .     ED Course:   In ED  Vitals    Review of Systems: As per HPI otherwise 10 point review of systems negative.   Past Medical History:  Diagnosis Date  . Asthma   . 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 COPD (chronic obstructive pulmonary disease) (HCC)   . Diabetes mellitus without complication (HCC)   . Hyperlipidemia   . Hypertension   . Osteopenia   . Osteoporosis   . Personal history of radiation therapy 2015   RIGHT lumpectomy -Mammosite  . Psoriasis   . Screen for colon cancer 08/02/2017   negative cologuard    Past Surgical History:  Procedure Laterality Date  . ABDOMINAL HYSTERECTOMY  1970's   partial  . APPENDECTOMY    . BREAST BIOPSY Bilateral 1980's  . BREAST BIOPSY Right 2015    INVASIVE MAMMARY CARCINOMA  . BREAST LUMPECTOMY Right 2015    INVASIVE MAMMARY CARCINOMA  . BREAST SURGERY Right 06/13/2014   8 mm ER+, PR+, Her 2 not over expressed, upper outer quadrant. Mammosite radiation.  . carpel tunnel Bilateral 1984  . CHOLECYSTECTOMY  1984  . COLONOSCOPY  2008   Dr Guss  . TUBAL LIGATION        reports that she has quit smoking. Her smoking use included cigarettes. She has a 40 pack-year smoking history. She has never used smokeless tobacco. She reports that she does not drink alcohol and does not use drugs.  Allergies  Allergen Reactions  . Lisinopril Other (See Comments)    Severe hypotension  . Other Anaphylaxis and Other (See Comments)    Yellow dye Elevated LFTs Yellow Dye  . Codeine Nausea And Vomiting and Nausea Only    Other reaction(s): Vomiting Other reaction(s): GI Intolerance    Family History  Problem Relation Age of Onset  . Pancreatitis Father   . Cancer Paternal Grandmother        breast  . Breast cancer Paternal Grandmother   . Breast cancer Paternal Aunt    *** Prior to Admission medications   Medication Sig Start Date End Date Taking? Authorizing Provider  albuterol (VENTOLIN HFA) 108 (90 Base) MCG/ACT inhaler  03/20/14   [provider]  albuterol (VENTOLIN HFA) 108 (90 Base) MCG/ACT inhaler Inhale into the lungs. 12/13/22   [provider]  amLODipine (NORVASC) 5 MG tablet Take 5 mg by mouth daily.    [provider]  aspirin EC 81 MG tablet Take by mouth. Patient not taking: Reported on 12/22/2021    [provider]  azelastine (ASTELIN) 0.1 % nasal spray  azelastine 137 mcg (0.1 %) nasal spray aerosol  PLACE 1 SPRAY INTO BOTH NOSTRILS 2 (TWO) TIMES DAILY AS NEEDED FOR RHINITIS 09/02/20   [provider]  Calcium Carbonate-Vit D-Min (CALTRATE 600+D PLUS PO) Take 1 tablet by mouth 3 (three) times a week.     [provider]  clobetasol (TEMOVATE) 0.05 % external solution USE 1 ML TWICE A DAY AS NEEDED. APPLY TO AFFECTED AREA ON SCALP. AVOID FACE/GROIN/AXILLA 07/05/17   [provider]  EPINEPHrine 0.3 mg/0.3 mL IJ SOAJ injection Inject 0.3 mg into the muscle once.    [provider]  fexofenadine (ALLEGRA) 180 MG tablet Take 1 tablet by mouth as needed.    [provider]   Fluticasone-Salmeterol (ADVAIR) 100-50 MCG/DOSE AEPB INHALE ONE PUFF 2 TIMES A DAY AS DIRECTED 12/30/14   [provider]  gabapentin (NEURONTIN) 100 MG capsule Take by mouth.    [provider]  hydrochlorothiazide (HYDRODIURIL) 25 MG tablet Take 25 mg by mouth daily.    [provider]  lovastatin (MEVACOR) 40 MG tablet Take 1 tablet by mouth daily. 12/13/22   [provider]  meloxicam (MOBIC) 15 MG tablet  12/18/15   [provider]  metFORMIN (GLUCOPHAGE) 500 MG tablet TAKE ONE TABLET BY MOUTH 2 TIMES A DAY 12/30/14   [provider]  metoprolol tartrate (LOPRESSOR) 25 MG tablet Take 25 mg by mouth 2 (two) times daily.    [provider]  montelukast (SINGULAIR) 10 MG tablet Take 10 mg by mouth daily.    [provider]  Multiple Vitamin (MULTIVITAMIN) capsule Take 1 capsule by mouth daily.    [provider]  Omega-3 Fatty Acids (FISH OIL) 1000 MG CAPS Take by mouth daily.    [provider]  OXYGEN  Inhale 2 L into the lungs.    [provider]  pyridOXINE (VITAMIN B-6) 100 MG tablet Take 100 mg by mouth daily. Patient not taking: Reported on 12/22/2021    [provider]  tiotropium (SPIRIVA) 18 MCG inhalation capsule Place 18 mcg into inhaler and inhale daily.    [provider]  venlafaxine XR (EFFEXOR-XR) 150 MG 24 hr capsule Take 150 mg by mouth daily with breakfast.    [provider]  vitamin B-12 (CYANOCOBALAMIN ) 100 MCG tablet Take 100 mcg by mouth daily.    [provider]    Physical Exam: Vitals:   06/29/24 2200 06/29/24 2249 06/29/24 2251 06/29/24 2300  BP: (!) 110/96  (!) 128/94 (!) 152/82  Pulse: 84  88 90  Resp: (!) 21  18 (!) 23  Temp:  98.3 F (36.8 C)    TempSrc:  Oral    SpO2: 100%  100% 100%    Constitutional: NAD, calm, comfortable Vitals:   06/29/24 2200 06/29/24 2249 06/29/24 2251 06/29/24 2300  BP: (!) 110/96  (!) 128/94 (!)  152/82  Pulse: 84  88 90  Resp: (!) 21  18 (!) 23  Temp:  98.3 F (36.8 C)    TempSrc:  Oral    SpO2: 100%  100% 100%   Eyes: PERRL, lids and conjunctivae normal ENMT: Mucous membranes are moist. Posterior pharynx clear of any exudate or lesions.Normal dentition.  Neck: normal, supple, no masses, no thyromegaly Respiratory: clear to auscultation bilaterally, no wheezing, no crackles. Normal respiratory effort. No accessory muscle use.  Cardiovascular: Regular rate and rhythm, no murmurs / rubs / gallops. No extremity edema. 2+ pedal pulses. No carotid bruits.  Abdomen:  no tenderness, no masses palpated. No hepatosplenomegaly. Bowel sounds positive.  Musculoskeletal: no clubbing / cyanosis. No joint deformity upper and lower extremities. Good ROM, no contractures. Normal muscle tone.  Skin: no rashes, lesions, ulcers. No induration Neurologic: CN 2-12 grossly intact. Sensation intact, DTR normal. Strength 5/5 in all 4.  Psychiatric: Normal judgment and insight. Alert and oriented x 3. Normal mood.    Labs on Admission: I have personally reviewed following labs and imaging studies  CBC: Recent Labs  Lab 06/29/24 1820 06/29/24 1835  WBC 6.3  --   NEUTROABS 4.1  --   HGB 11.6* 11.9*  HCT 35.8* 35.0*  MCV 99.2  --   PLT 261  --    Basic Metabolic Panel: Recent Labs  Lab 06/29/24 1820 06/29/24 1835  NA 142 142  K 3.5 3.3*  CL 101 102  CO2 28  --   GLUCOSE 143* 147*  BUN 11 14  CREATININE 0.84 1.00  CALCIUM 9.1  --    GFR: CrCl cannot be calculated (Unknown ideal weight.). Liver Function Tests: Recent Labs  Lab 06/29/24 1820  AST 27  ALT 28  ALKPHOS 70  BILITOT 0.3  PROT 7.5  ALBUMIN 3.8   No results for input(s): LIPASE, AMYLASE in the last 168 hours. No results for input(s): AMMONIA in the last 168 hours. Coagulation Profile: No results for input(s): INR, PROTIME in the last 168 hours. Cardiac Enzymes: No results for input(s): CKTOTAL, CKMB,  CKMBINDEX, TROPONINI in the last 168 hours. BNP (last 3 results) No results for input(s): PROBNP in the last 8760 hours. HbA1C: No results for input(s): HGBA1C in the last 72 hours. CBG: No results for input(s): GLUCAP in the last 168 hours. Lipid Profile: No results for input(s): CHOL, HDL, LDLCALC, TRIG, CHOLHDL, LDLDIRECT in the last 72 hours. Thyroid  Function Tests: No results for input(s): TSH, T4TOTAL, FREET4, T3FREE, THYROIDAB in the last 72 hours. Anemia Panel: No results for input(s): VITAMINB12, FOLATE, FERRITIN, TIBC, IRON, RETICCTPCT in the last 72 hours. Urine analysis:    Component Value Date/Time   COLORURINE Straw 12/24/2011 0850   APPEARANCEUR Clear 12/24/2011 0850   LABSPEC 1.008 12/24/2011 0850   PHURINE 6.0 12/24/2011 0850   GLUCOSEU Negative 12/24/2011 0850   HGBUR Negative 12/24/2011 0850   BILIRUBINUR Negative 12/24/2011 0850   KETONESUR Negative 12/24/2011 0850   PROTEINUR Negative 12/24/2011 0850   NITRITE Negative 12/24/2011 0850   LEUKOCYTESUR Negative 12/24/2011 0850    Radiological Exams on Admission: CT Angio Chest PE W and/or Wo Contrast Result Date: 06/29/2024 EXAM: CTA CHEST 06/29/2024 07:14:29 PM TECHNIQUE: CTA of the chest was performed without and with the administration of 75 mL of intravenous iohexol  (OMNIPAQUE ) 350 MG/ML injection. Multiplanar reformatted images are provided for review. MIP images are provided for review. Automated exposure control, iterative reconstruction, and/or weight based adjustment of the mA/kV was utilized to reduce the radiation dose to as low as reasonably achievable. COMPARISON: None available. CLINICAL HISTORY: Chest pain. LOC. Pulmonary embolism (PE) suspected, high prob. FINDINGS: PULMONARY ARTERIES: Pulmonary arteries are adequately opacified for evaluation. No acute pulmonary embolus. Main pulmonary artery is normal in caliber. MEDIASTINUM: The heart and pericardium  demonstrate no acute abnormality. No thoracic aortic aneurysm or dissection. Thoracic aortic atherosclerosis. LYMPH NODES: No mediastinal, hilar or axillary lymphadenopathy. LUNGS AND PLEURA: Mild centrilobular emphysematous changes. Faint perifissural nodules along the right major fissure (image 69) reflect benign subpleural lymph nodes; no follow-up is recommended. Mild lingular atelectasis. No focal consolidation or  pulmonary edema. No evidence of pleural effusion or pneumothorax. UPPER ABDOMEN: Limited images of the upper abdomen are unremarkable. SOFT TISSUES AND BONES: Mild degenerative changes of the mid thoracic spine. No acute soft tissue abnormality. IMPRESSION: 1. No pulmonary embolism. 2. No acute findings. Electronically signed by: Pinkie Pebbles MD 06/29/2024 07:33 PM EST RP Workstation: [id number]   DG Chest Port 1 View Result Date: 06/29/2024 EXAM: 1 VIEW(S) XRAY OF THE CHEST 06/29/2024 06:43:00 PM COMPARISON: 08/20/2009 CLINICAL HISTORY: cp loc FINDINGS: LUNGS AND PLEURA: No focal pulmonary opacity. No pleural effusion. No pneumothorax. HEART AND MEDIASTINUM: Thoracic aortic atherosclerosis. No acute abnormality of the cardiac silhouette. BONES AND SOFT TISSUES: No acute osseous abnormality. IMPRESSION: 1. No acute findings. Electronically signed by: Pinkie Pebbles MD 06/29/2024 07:11 PM EST RP Workstation: [id number]    EKG: Independently reviewed. ***  Assessment/Plan Principal Problem:   Cardiac related syncope   ***  DVT prophylaxis: *** (Lovenox/Heparin/SCD's/anticoagulated/None (if comfort care) Code Status: *** (Full/Partial (specify details) Family Communication: *** (Specify name, relationship. Do not write discussed with patient. Specify tel # if discussed over the phone) Disposition Plan: *** (specify when and where you expect patient to be discharged) Consults called: *** (with names) Admission status: *** (inpatient / obs / tele / medical floor /  SDU)   Camila DELENA Ned MD [facility] Pager [phone]  If 7PM-7AM, please contact night-coverage [email or web address] Password [email or web address]  06/29/2024, 11:52 PM

## 2024-06-29 NOTE — ED Notes (Signed)
 Pt left ED AMA, pt IV removed before departure from ED.

## 2024-06-29 NOTE — ED Provider Notes (Addendum)
 [place] EMERGENCY DEPARTMENT AT [facility] Provider Note  CSN: [id number] Arrival date & time: 06/29/24 8190  Chief Complaint(s) Chest Pain and Loss of Consciousness  HPI Sandra Burnett is a 78 y.o. female with past medical history as below, significant for COPD, DM, HLD, HTN, home oxygen  dependent who presents to the ED with complaint of chest pain, syncope, dyspnea  Report chest pain since yesterday afternoon, aching, pressure sensation, tightness sensation.  Midsternal w/ radiation to her shoulders.  Denies similar symptoms in the past.  Pain seems to worsen today.  Now having the pain at rest and with exertion.  Having some dyspnea that seems be worsening.  History of COPD.  While en route to the hospital she had syncopal episode in the ambulance, no seizure activity per EMS.  Patient denies significant prodrome.  She reports ongoing chest pain while in the ER.  Patient reports increased home stressors over the past week that she felt was provoking her chest pain initially.  She has nausea no vomiting, no change to bowel or bladder function.  No fevers or chills.  No rash.  Took 325 mg aspirin prior to arrival  Past Medical History Past Medical History:  Diagnosis Date   Asthma    Breast cancer (HCC) right   31- mammosite    COPD (chronic obstructive pulmonary disease) (HCC)    Diabetes mellitus without complication (HCC)    Hyperlipidemia    Hypertension    Osteopenia    Osteoporosis    Personal history of radiation therapy 2015   RIGHT lumpectomy -Mammosite   Psoriasis    Screen for colon cancer 08/02/2017   negative cologuard   Patient Active Problem List   Diagnosis Date Noted   COPD with asthma (HCC) 04/16/2019   Chronic respiratory failure with hypoxia (HCC) 08/01/2018   Degeneration of thoracic intervertebral disc 12/07/2017   Aortic atherosclerosis 06/14/2016   Vitamin B12 deficiency 05/05/2016   Radial styloid tenosynovitis 11/10/2015    Depression, major, in remission 10/17/2015   Primary osteoarthritis involving multiple joints 10/17/2015   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 Authorizing Provider  albuterol (VENTOLIN HFA) 108 (90 Base) MCG/ACT inhaler  03/20/14   [provider]  albuterol (VENTOLIN HFA) 108 (90 Base) MCG/ACT inhaler Inhale into the lungs. 12/13/22   [provider]  amLODipine (NORVASC) 5 MG tablet Take 5 mg by mouth daily.    [provider]  aspirin EC 81 MG tablet Take by mouth. Patient not taking: Reported on 12/22/2021    [provider]  azelastine (ASTELIN) 0.1 % nasal spray azelastine 137 mcg (0.1 %) nasal spray aerosol  PLACE 1 SPRAY INTO BOTH NOSTRILS 2 (TWO) TIMES DAILY AS NEEDED FOR RHINITIS 09/02/20   [provider]  Calcium Carbonate-Vit D-Min (CALTRATE 600+D PLUS PO) Take 1 tablet by mouth 3 (three) times a week.     [provider]  clobetasol (TEMOVATE) 0.05 % external solution USE 1 ML TWICE A DAY AS NEEDED. APPLY TO AFFECTED AREA  ON SCALP. AVOID FACE/GROIN/AXILLA 07/05/17   [provider]  EPINEPHrine 0.3 mg/0.3 mL IJ SOAJ injection Inject 0.3 mg into the muscle once.    [provider]  fexofenadine (ALLEGRA) 180 MG tablet Take 1 tablet by mouth as needed.    [provider]  Fluticasone-Salmeterol (ADVAIR) 100-50 MCG/DOSE AEPB INHALE ONE PUFF 2 TIMES A DAY AS DIRECTED 12/30/14    [provider]  gabapentin (NEURONTIN) 100 MG capsule Take by mouth.    [provider]  hydrochlorothiazide (HYDRODIURIL) 25 MG tablet Take 25 mg by mouth daily.    [provider]  lovastatin (MEVACOR) 40 MG tablet Take 1 tablet by mouth daily. 12/13/22   [provider]  meloxicam (MOBIC) 15 MG tablet  12/18/15   [provider]  metFORMIN (GLUCOPHAGE) 500 MG tablet TAKE ONE TABLET BY MOUTH 2 TIMES A DAY 12/30/14   [provider]  metoprolol tartrate (LOPRESSOR) 25 MG tablet Take 25 mg by mouth 2 (two) times daily.    [provider]  montelukast (SINGULAIR) 10 MG tablet Take 10 mg by mouth daily.    [provider]  Multiple Vitamin (MULTIVITAMIN) capsule Take 1 capsule by mouth daily.    [provider]  Omega-3 Fatty Acids (FISH OIL) 1000 MG CAPS Take by mouth daily.    [provider]  OXYGEN  Inhale 2 L into the lungs.    [provider]  pyridOXINE (VITAMIN B-6) 100 MG tablet Take 100 mg by mouth daily. Patient not taking: Reported on 12/22/2021    [provider]  tiotropium (SPIRIVA) 18 MCG inhalation capsule Place 18 mcg into inhaler and inhale daily.    [provider]  venlafaxine XR (EFFEXOR-XR) 150 MG 24 hr capsule Take 150 mg by mouth daily with breakfast.    [provider]  vitamin B-12 (CYANOCOBALAMIN ) 100 MCG tablet Take 100 mcg by mouth daily.    [provider]                                                                                                                                    Past Surgical History Past Surgical History:  Procedure Laterality Date   ABDOMINAL HYSTERECTOMY  1970's   partial   APPENDECTOMY     BREAST BIOPSY Bilateral 1980's   BREAST BIOPSY Right 2015    INVASIVE MAMMARY CARCINOMA   BREAST LUMPECTOMY Right 2015    INVASIVE MAMMARY CARCINOMA   BREAST SURGERY Right 06/13/2014   8 mm ER+, PR+, Her 2 not over  expressed, upper outer quadrant. Mammosite radiation.   carpel tunnel Bilateral 1984   CHOLECYSTECTOMY  1984   COLONOSCOPY  2008   Dr Guss   TUBAL LIGATION     Family History Family History  Problem Relation Age of Onset   Pancreatitis Father    Cancer Paternal Grandmother        breast  Breast cancer Paternal Grandmother    Breast cancer Paternal Aunt     Social History Social History   Tobacco Use   Smoking status: Former    Current packs/day: 1.00    Average packs/day: 1 pack/day for 40.0 years (40.0 ttl pk-yrs)    Types: Cigarettes   Smokeless tobacco: Never  Substance Use Topics   Alcohol use: No   Drug use: No   Allergies Lisinopril, Other, and Codeine  Review of Systems A thorough review of systems was obtained and all systems are negative except as noted in the HPI and PMH.   Physical Exam Vital Signs  I have reviewed the triage vital signs BP (!) 128/94   Pulse 88   Temp 98.3 F (36.8 C) (Oral)   Resp 18   SpO2 100%  Physical Exam Vitals and nursing note reviewed.  Constitutional:      General: She is not in acute distress.    Appearance: Normal appearance.  HENT:     Head: Normocephalic and atraumatic.     Right Ear: External ear normal.     Left Ear: External ear normal.     Nose: Nose normal.     Mouth/Throat:     Mouth: Mucous membranes are moist.  Eyes:     General: No scleral icterus.       Right eye: No discharge.        Left eye: No discharge.  Cardiovascular:     Rate and Rhythm: Normal rate and regular rhythm.     Pulses: Normal pulses.     Heart sounds: Normal heart sounds.  Pulmonary:     Effort: Pulmonary effort is normal. No respiratory distress.     Breath sounds: No stridor. Wheezing present.  Abdominal:     General: Abdomen is flat. There is no distension.     Palpations: Abdomen is soft.     Tenderness: There is no abdominal tenderness.  Musculoskeletal:     Cervical back: No rigidity.     Right lower leg: No  edema.     Left lower leg: No edema.  Skin:    General: Skin is warm and dry.     Capillary Refill: Capillary refill takes less than 2 seconds.  Neurological:     Mental Status: She is alert.  Psychiatric:        Mood and Affect: Mood normal.        Behavior: Behavior normal. Behavior is cooperative.     ED Results and Treatments Labs (all labs ordered are listed, but only abnormal results are displayed) Labs Reviewed  COMPREHENSIVE METABOLIC PANEL WITH GFR - Abnormal; Notable for the following components:      Result Value   Glucose, Bld 143 (*)    All other components within normal limits  CBC WITH DIFFERENTIAL/PLATELET - Abnormal; Notable for the following components:   RBC 3.61 (*)    Hemoglobin 11.6 (*)    HCT 35.8 (*)    All other components within normal limits  I-STAT CHEM 8, ED - Abnormal; Notable for the following components:   Potassium 3.3 (*)    Glucose, Bld 147 (*)    Hemoglobin 11.9 (*)    HCT 35.0 (*)    All other components within normal limits  TROPONIN I (HIGH SENSITIVITY)  TROPONIN I (HIGH SENSITIVITY)  Radiology CT Angio Chest PE W and/or Wo Contrast Result Date: 06/29/2024 EXAM: CTA CHEST 06/29/2024 07:14:29 PM TECHNIQUE: CTA of the chest was performed without and with the administration of 75 mL of intravenous iohexol  (OMNIPAQUE ) 350 MG/ML injection. Multiplanar reformatted images are provided for review. MIP images are provided for review. Automated exposure control, iterative reconstruction, and/or weight based adjustment of the mA/kV was utilized to reduce the radiation dose to as low as reasonably achievable. COMPARISON: None available. CLINICAL HISTORY: Chest pain. LOC. Pulmonary embolism (PE) suspected, high prob. FINDINGS: PULMONARY ARTERIES: Pulmonary arteries are adequately opacified for evaluation. No acute pulmonary embolus.  Main pulmonary artery is normal in caliber. MEDIASTINUM: The heart and pericardium demonstrate no acute abnormality. No thoracic aortic aneurysm or dissection. Thoracic aortic atherosclerosis. LYMPH NODES: No mediastinal, hilar or axillary lymphadenopathy. LUNGS AND PLEURA: Mild centrilobular emphysematous changes. Faint perifissural nodules along the right major fissure (image 69) reflect benign subpleural lymph nodes; no follow-up is recommended. Mild lingular atelectasis. No focal consolidation or pulmonary edema. No evidence of pleural effusion or pneumothorax. UPPER ABDOMEN: Limited images of the upper abdomen are unremarkable. SOFT TISSUES AND BONES: Mild degenerative changes of the mid thoracic spine. No acute soft tissue abnormality. IMPRESSION: 1. No pulmonary embolism. 2. No acute findings. Electronically signed by: Pinkie Pebbles MD 06/29/2024 07:33 PM EST RP Workstation: [id number]   DG Chest Port 1 View Result Date: 06/29/2024 EXAM: 1 VIEW(S) XRAY OF THE CHEST 06/29/2024 06:43:00 PM COMPARISON: 08/20/2009 CLINICAL HISTORY: cp loc FINDINGS: LUNGS AND PLEURA: No focal pulmonary opacity. No pleural effusion. No pneumothorax. HEART AND MEDIASTINUM: Thoracic aortic atherosclerosis. No acute abnormality of the cardiac silhouette. BONES AND SOFT TISSUES: No acute osseous abnormality. IMPRESSION: 1. No acute findings. Electronically signed by: Pinkie Pebbles MD 06/29/2024 07:11 PM EST RP Workstation: [id number]    Pertinent labs & imaging results that were available during my care of the patient were reviewed by me and considered in my medical decision making (see MDM for details).  Medications Ordered in ED Medications  ipratropium-albuterol (DUONEB) 0.5-2.5 (3) MG/3ML nebulizer solution 3 mL (3 mLs Nebulization Patient Refused/Not Given 06/29/24 1925)  nitroGLYCERIN  (NITROSTAT ) SL tablet 0.4 mg (0.4 mg Sublingual Given 06/29/24 1929)  ondansetron (ZOFRAN) injection 4 mg (4 mg Intravenous  Patient Refused/Not Given 06/29/24 2032)  iohexol  (OMNIPAQUE ) 350 MG/ML injection 75 mL (75 mLs Intravenous Contrast Given 06/29/24 1914)  acetaminophen (TYLENOL) tablet 1,000 mg (1,000 mg Oral Given 06/29/24 1929)  oxyCODONE (Oxy IR/ROXICODONE) immediate release tablet 5 mg (5 mg Oral Given 06/29/24 2239)                                                                                                                                     Procedures .Critical Care  Performed by: Elnor Jayson LABOR, DO Authorized by: Elnor Jayson LABOR, DO   Critical care provider statement:    Critical care time (minutes):  30  Critical care time was exclusive of:  Separately billable procedures and treating other patients   Critical care was time spent personally by me on the following activities:  Development of treatment plan with patient or surrogate, discussions with consultants, evaluation of patient's response to treatment, examination of patient, ordering and review of laboratory studies, ordering and review of radiographic studies, ordering and performing treatments and interventions, pulse oximetry, re-evaluation of patient's condition, review of old charts and obtaining history from patient or surrogate   Care discussed with: admitting provider     (including critical care time)  Medical Decision Making / ED Course    Medical Decision Making:    Sandra Burnett is a 78 y.o. female with past medical history as below, significant for COPD, DM, HLD, HTN, home oxygen  dependent who presents to the ED with complaint of chest pain, syncope, dyspnea. The complaint involves an extensive differential diagnosis and also carries with it a high risk of complications and morbidity.  Serious etiology was considered. Ddx includes but is not limited to: Differential includes all life-threatening causes for chest pain. This includes but is not exclusive to acute coronary syndrome, aortic dissection, pulmonary embolism,  cardiac tamponade, community-acquired pneumonia, pericarditis, musculoskeletal chest wall pain, etc.   Complete initial physical exam performed, notably the patient was in NAD.    Reviewed and confirmed nursing documentation for past medical history, family history, social history.  Vital signs reviewed.    Unstable angina  Pos cardiac syncope > - Patient with anginal chest pain at rest, seems to worsening.  Aching, tightness sensation radiates to her shoulders - Also had associated syncope en route to the hospital.  Concern for possible cardiac etiology - Labs are assuring, CT a chest is also reassuring.  Consult cardiology, plan to admit patient for cardiac syncope evaluation - Patient agreeable to admission, unfortunately she has abruptly left the ER and says that she needs to leave, does not want to discuss any further.  She does not want to discuss her treatment any further and is not interested in any further treatment at this time.  Patient refused to sign paperwork, she is leaving AGAINST MEDICAL ADVICE.  Clinical Course as of 06/29/24 2345  Fri Jun 29, 2024  1934 Ct coronary 9/24  -calcium score 162 - mild LAD and RCA stenosis   [SG]  2155 Spoke w/ Dr Mallipeddi, see consult note. Will plan for admit/obs  [SG]  2207 Pt has abruptly left the ED [SG]    Clinical Course User Index [SG] Elnor Jayson LABOR, DO   >> pt has presented back, she is agreeable to plan for admission at this time. Will admit to hospitalist service                 Additional history obtained: -Additional history obtained from family -External records from outside source obtained and reviewed including: Chart review including previous notes, labs, imaging, consultation notes including  Allergy list Home meds   Lab Tests: -I ordered, reviewed, and interpreted labs.   The pertinent results include:   Labs Reviewed  COMPREHENSIVE METABOLIC PANEL WITH GFR - Abnormal; Notable for the following  components:      Result Value   Glucose, Bld 143 (*)    All other components within normal limits  CBC WITH DIFFERENTIAL/PLATELET - Abnormal; Notable for the following components:   RBC 3.61 (*)    Hemoglobin 11.6 (*)    HCT 35.8 (*)    All other components within  normal limits  I-STAT CHEM 8, ED - Abnormal; Notable for the following components:   Potassium 3.3 (*)    Glucose, Bld 147 (*)    Hemoglobin 11.9 (*)    HCT 35.0 (*)    All other components within normal limits  TROPONIN I (HIGH SENSITIVITY)  TROPONIN I (HIGH SENSITIVITY)    Notable for labs stable  EKG   EKG Interpretation Date/Time:  Friday June 29 2024 18:15:49 EST Ventricular Rate:  92 PR Interval:  145 QRS Duration:  155 QT Interval:  420 QTC Calculation: 520 R Axis:   -83  Text Interpretation: Sinus rhythm RBBB and LAFB LVH with secondary repolarization abnormality Confirmed by Elnor Savant ([id number]) on 06/29/2024 6:18:52 PM         Imaging Studies ordered: I ordered imaging studies including CXR CTPE I independently visualized the following imaging with scope of interpretation limited to determining acute life threatening conditions related to emergency care; findings noted above I agree with the radiologist interpretation If any imaging was obtained with contrast I closely monitored patient for any possible adverse reaction a/w contrast administration in the emergency department   Medicines ordered and prescription drug management: Meds ordered this encounter  Medications   ipratropium-albuterol (DUONEB) 0.5-2.5 (3) MG/3ML nebulizer solution 3 mL   nitroGLYCERIN  (NITROSTAT ) SL tablet 0.4 mg   iohexol  (OMNIPAQUE ) 350 MG/ML injection 75 mL   acetaminophen (TYLENOL) tablet 1,000 mg   ondansetron (ZOFRAN) injection 4 mg   oxyCODONE (Oxy IR/ROXICODONE) immediate release tablet 5 mg    Refill:  0    -I have reviewed the patients home medicines and have made adjustments as needed   Consultations  Obtained: I requested consultation with the cardiology,  and discussed lab and imaging findings as well as pertinent plan    Cardiac Monitoring: The patient was maintained on a cardiac monitor.  I personally viewed and interpreted the cardiac monitored which showed an underlying rhythm of: NSR Continuous pulse oximetry interpreted by myself, 100% on RA.    Social Determinants of Health:  Diagnosis or treatment significantly limited by social determinants of health: former smoker   Reevaluation: After the interventions noted above, I reevaluated the patient and found that they have improved  Co morbidities that complicate the patient evaluation  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    COPD (chronic obstructive pulmonary disease) (HCC)    Diabetes mellitus without complication (HCC)    Hyperlipidemia    Hypertension    Osteopenia    Osteoporosis    Personal history of radiation therapy 2015   RIGHT lumpectomy -Mammosite   Psoriasis    Screen for colon cancer 08/02/2017   negative cologuard      Dispostion: Disposition decision including need for hospitalization was considered, and patient Left against medical advice    Final Clinical Impression(s) / ED Diagnoses Final diagnoses:  Syncope, unspecified syncope type  Chest pain, unspecified type  Left against medical advice        Elnor Savant LABOR, DO 06/29/24 2213    Elnor Savant LABOR, DO 06/29/24 2345

## 2024-06-29 NOTE — ED Notes (Signed)
Patient in CT scanner.

## 2024-06-29 NOTE — ED Triage Notes (Signed)
 The patient arrives via EMS from home with CC of chest pain radiating to the R shoulder since 3pm. Then en route, the patient passed out in the ambulance after IV placement, but EMS states her pressure never dropped down and it did not appear to be vasovagal.   Patient took 4 aspirin at home prior to calling EMS  EMS vitals and inteverntions BP 197/97 HR 94 O2 99 on RA CBG 125 RR 20 22G L Hand   -With me the patient is AxOx4, alert, chest pain rated 6/10 at this time.

## 2024-06-29 NOTE — ED Notes (Signed)
Pt used bedside commode.

## 2024-06-29 NOTE — H&P (Addendum)
 History and Physical    [name] FMW:[id number] DOB: 1946-04-16 DOA: 06/29/2024  PCP: Sandra Ophelia JINNY DOUGLAS, MD  Patient coming from: home  I have personally briefly reviewed patient's old medical records in [facility]  Chief Complaint:  chest pain with radiation to R shoulder   HPI: Sandra Burnett is a 78 y.o. female with medical history significant of Asthma, COPD O2 dependent, DMII, HTN, HLD,psoriasis,  who presents to ED with with complaint of chest pain that began around 3pm day of presentation.Patient states she has  had increase stress recently an the last few days she has had chest pain. Patient describes pain as substernal  aching pressure, with radiation to her shoulders that is associated with DOE and nausea. Patient course further complicated by episode of syncope en route with ambulance. Per EMS no seizure like activity, no change in pulse rate or blood pressure.  Patient denies any prodrome. NO fever/chills/ cough . Patient states feels back to her baseline, no further chest pain.  NO increase sob or cough. No fever/chills    ED Course:  In ED  Afeb , bp 172/100, hr 85, rr85, r 21 sat 100%  EKG: nsr rb , RBBB, LAFB , LVH  Labs  Wbc 6.3, hgb 11.6, plt 261, CE 5  Na 142, K 3.3, Cl 101, bicarb 28, glu 143, cr 0.84, cal 9.1, AST 27, ALT 28, AG 13  CXR  -NAD  CT IMPRESSION: 1. No pulmonary embolism. 2. No acute findings.   Patient on presentation endorsed continued chest pain. Patient was seen by cardiology who recommended admission and f/u with echo in am      Patient slated for admission to ED initially agreed but then abruptly left am but was brought back by son. patient then returned and agreeable to admission      Review of Systems: As per HPI otherwise 10 point review of systems negative.   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    COPD (chronic obstructive pulmonary disease) (HCC)     Diabetes mellitus without complication (HCC)    Hyperlipidemia    Hypertension    Osteopenia    Osteoporosis    Personal history of radiation therapy 2015   RIGHT lumpectomy -Mammosite   Psoriasis    Screen for colon cancer 08/02/2017   negative cologuard    Past Surgical History:  Procedure Laterality Date   ABDOMINAL HYSTERECTOMY  1970's   partial   APPENDECTOMY     BREAST BIOPSY Bilateral 1980's   BREAST BIOPSY Right 2015    INVASIVE MAMMARY CARCINOMA   BREAST LUMPECTOMY Right 2015    INVASIVE MAMMARY CARCINOMA   BREAST SURGERY Right 06/13/2014   8 mm ER+, PR+, Her 2 not over expressed, upper outer quadrant. Mammosite radiation.   carpel tunnel Bilateral 1984   CHOLECYSTECTOMY  1984   COLONOSCOPY  2008   Dr Guss   TUBAL LIGATION       reports that she has quit smoking. Her smoking use included cigarettes. She has a 40 pack-year smoking history. She has never used smokeless tobacco. She reports that she does not drink alcohol and does not use drugs.  Allergies  Allergen Reactions   Lisinopril Other (See Comments)    Severe hypotension   Other Anaphylaxis and Other (See Comments)    Yellow dye Elevated LFTs Yellow Dye   Codeine Nausea And Vomiting and  Nausea Only    Other reaction(s): Vomiting Other reaction(s): GI Intolerance    Family History  Problem Relation Age of Onset   Pancreatitis Father    Cancer Paternal Grandmother        breast   Breast cancer Paternal Grandmother    Breast cancer Paternal Aunt     Prior to Admission medications   Medication Sig Start Date End Date Taking? Authorizing Provider  albuterol (VENTOLIN HFA) 108 (90 Base) MCG/ACT inhaler  03/20/14   [provider]  albuterol (VENTOLIN HFA) 108 (90 Base) MCG/ACT inhaler Inhale into the lungs. 12/13/22   [provider]  amLODipine (NORVASC) 5 MG tablet Take 5 mg by mouth daily.    [provider]  aspirin EC 81 MG tablet Take by mouth. Patient not  taking: Reported on 12/22/2021    [provider]  azelastine (ASTELIN) 0.1 % nasal spray azelastine 137 mcg (0.1 %) nasal spray aerosol  PLACE 1 SPRAY INTO BOTH NOSTRILS 2 (TWO) TIMES DAILY AS NEEDED FOR RHINITIS 09/02/20   [provider]  Calcium Carbonate-Vit D-Min (CALTRATE 600+D PLUS PO) Take 1 tablet by mouth 3 (three) times a week.     [provider]  clobetasol (TEMOVATE) 0.05 % external solution USE 1 ML TWICE A DAY AS NEEDED. APPLY TO AFFECTED AREA ON SCALP. AVOID FACE/GROIN/AXILLA 07/05/17   [provider]  EPINEPHrine 0.3 mg/0.3 mL IJ SOAJ injection Inject 0.3 mg into the muscle once.    [provider]  fexofenadine (ALLEGRA) 180 MG tablet Take 1 tablet by mouth as needed.    [provider]  Fluticasone-Salmeterol (ADVAIR) 100-50 MCG/DOSE AEPB INHALE ONE PUFF 2 TIMES A DAY AS DIRECTED 12/30/14   [provider]  gabapentin (NEURONTIN) 100 MG capsule Take by mouth.    [provider]  hydrochlorothiazide (HYDRODIURIL) 25 MG tablet Take 25 mg by mouth daily.    [provider]  lovastatin (MEVACOR) 40 MG tablet Take 1 tablet by mouth daily. 12/13/22   [provider]  meloxicam (MOBIC) 15 MG tablet  12/18/15   [provider]  metFORMIN (GLUCOPHAGE) 500 MG tablet TAKE ONE TABLET BY MOUTH 2 TIMES A DAY 12/30/14   [provider]  metoprolol tartrate (LOPRESSOR) 25 MG tablet Take 25 mg by mouth 2 (two) times daily.    [provider]  montelukast (SINGULAIR) 10 MG tablet Take 10 mg by mouth daily.    [provider]  Multiple Vitamin (MULTIVITAMIN) capsule Take 1 capsule by mouth daily.    [provider]  Omega-3 Fatty Acids (FISH OIL) 1000 MG CAPS Take by mouth daily.    [provider]  OXYGEN  Inhale 2 L into the lungs.    [provider]  pyridOXINE (VITAMIN B-6) 100 MG tablet Take 100 mg by mouth daily. Patient not taking: Reported on  12/22/2021    [provider]  tiotropium (SPIRIVA) 18 MCG inhalation capsule Place 18 mcg into inhaler and inhale daily.    [provider]  venlafaxine XR (EFFEXOR-XR) 150 MG 24 hr capsule Take 150 mg by mouth daily with breakfast.    [provider]  vitamin B-12 (CYANOCOBALAMIN ) 100 MCG tablet Take 100 mcg by mouth daily.    [provider]    Physical Exam: Vitals:   06/29/24 2200 06/29/24 2249 06/29/24 2251 06/29/24 2300  BP: (!) 110/96  (!) 128/94 (!) 152/82  Pulse: 84  88 90  Resp: (!) 21  18 (!)  23  Temp:  98.3 F (36.8 C)    TempSrc:  Oral    SpO2: 100%  100% 100%    Constitutional: NAD, calm, comfortable Vitals:   06/29/24 2200 06/29/24 2249 06/29/24 2251 06/29/24 2300  BP: (!) 110/96  (!) 128/94 (!) 152/82  Pulse: 84  88 90  Resp: (!) 21  18 (!) 23  Temp:  98.3 F (36.8 C)    TempSrc:  Oral    SpO2: 100%  100% 100%   Eyes: PERRL, lids and conjunctivae normal ENMT: Mucous membranes are moist. Posterior pharynx clear of any exudate or lesions.Normal dentition.  Neck: normal, supple, no masses, no thyromegaly Respiratory: clear to auscultation bilaterally, no wheezing, no crackles. Normal respiratory effort. No accessory muscle use.  Cardiovascular: Regular rate and rhythm, no murmurs / rubs / gallops. No extremity edema. 2+ pedal pulses.   Abdomen: no tenderness, no masses palpated. No hepatosplenomegaly. Bowel sounds positive.  Musculoskeletal: no clubbing / cyanosis. No joint deformity upper and lower extremities. Good ROM, no contractures. Normal muscle tone.  Skin: no rashes, lesions, ulcers. No induration Neurologic: CN 2-12 grossly intact. Sensation intact, Strength 5/5 in all 4.  Psychiatric: Normal judgment and insight. Alert and oriented x 3. Normal mood.    Labs on Admission: I have personally reviewed following labs and imaging studies  CBC: Recent Labs  Lab 06/29/24 1820 06/29/24 1835  WBC 6.3  --   NEUTROABS 4.1   --   HGB 11.6* 11.9*  HCT 35.8* 35.0*  MCV 99.2  --   PLT 261  --    Basic Metabolic Panel: Recent Labs  Lab 06/29/24 1820 06/29/24 1835  NA 142 142  K 3.5 3.3*  CL 101 102  CO2 28  --   GLUCOSE 143* 147*  BUN 11 14  CREATININE 0.84 1.00  CALCIUM 9.1  --    GFR: CrCl cannot be calculated (Unknown ideal weight.). Liver Function Tests: Recent Labs  Lab 06/29/24 1820  AST 27  ALT 28  ALKPHOS 70  BILITOT 0.3  PROT 7.5  ALBUMIN 3.8   No results for input(s): LIPASE, AMYLASE in the last 168 hours. No results for input(s): AMMONIA in the last 168 hours. Coagulation Profile: No results for input(s): INR, PROTIME in the last 168 hours. Cardiac Enzymes: No results for input(s): CKTOTAL, CKMB, CKMBINDEX, TROPONINI in the last 168 hours. BNP (last 3 results) No results for input(s): PROBNP in the last 8760 hours. HbA1C: No results for input(s): HGBA1C in the last 72 hours. CBG: No results for input(s): GLUCAP in the last 168 hours. Lipid Profile: No results for input(s): CHOL, HDL, LDLCALC, TRIG, CHOLHDL, LDLDIRECT in the last 72 hours. Thyroid  Function Tests: No results for input(s): TSH, T4TOTAL, FREET4, T3FREE, THYROIDAB in the last 72 hours. Anemia Panel: No results for input(s): VITAMINB12, FOLATE, FERRITIN, TIBC, IRON, RETICCTPCT in the last 72 hours. Urine analysis:    Component Value Date/Time   COLORURINE Straw 12/24/2011 0850   APPEARANCEUR Clear 12/24/2011 0850   LABSPEC 1.008 12/24/2011 0850   PHURINE 6.0 12/24/2011 0850   GLUCOSEU Negative 12/24/2011 0850   HGBUR Negative 12/24/2011 0850   BILIRUBINUR Negative 12/24/2011 0850   KETONESUR Negative 12/24/2011 0850   PROTEINUR Negative 12/24/2011 0850   NITRITE Negative 12/24/2011 0850   LEUKOCYTESUR Negative 12/24/2011 0850    Radiological Exams on Admission: CT Angio Chest PE W and/or Wo Contrast Result Date: 06/29/2024 EXAM: CTA CHEST  06/29/2024 07:14:29 PM TECHNIQUE: CTA of the chest  was performed without and with the administration of 75 mL of intravenous iohexol  (OMNIPAQUE ) 350 MG/ML injection. Multiplanar reformatted images are provided for review. MIP images are provided for review. Automated exposure control, iterative reconstruction, and/or weight based adjustment of the mA/kV was utilized to reduce the radiation dose to as low as reasonably achievable. COMPARISON: None available. CLINICAL HISTORY: Chest pain. LOC. Pulmonary embolism (PE) suspected, high prob. FINDINGS: PULMONARY ARTERIES: Pulmonary arteries are adequately opacified for evaluation. No acute pulmonary embolus. Main pulmonary artery is normal in caliber. MEDIASTINUM: The heart and pericardium demonstrate no acute abnormality. No thoracic aortic aneurysm or dissection. Thoracic aortic atherosclerosis. LYMPH NODES: No mediastinal, hilar or axillary lymphadenopathy. LUNGS AND PLEURA: Mild centrilobular emphysematous changes. Faint perifissural nodules along the right major fissure (image 69) reflect benign subpleural lymph nodes; no follow-up is recommended. Mild lingular atelectasis. No focal consolidation or pulmonary edema. No evidence of pleural effusion or pneumothorax. UPPER ABDOMEN: Limited images of the upper abdomen are unremarkable. SOFT TISSUES AND BONES: Mild degenerative changes of the mid thoracic spine. No acute soft tissue abnormality. IMPRESSION: 1. No pulmonary embolism. 2. No acute findings. Electronically signed by: Pinkie Pebbles MD 06/29/2024 07:33 PM EST RP Workstation: [id number]   DG Chest Port 1 View Result Date: 06/29/2024 EXAM: 1 VIEW(S) XRAY OF THE CHEST 06/29/2024 06:43:00 PM COMPARISON: 08/20/2009 CLINICAL HISTORY: cp loc FINDINGS: LUNGS AND PLEURA: No focal pulmonary opacity. No pleural effusion. No pneumothorax. HEART AND MEDIASTINUM: Thoracic aortic atherosclerosis. No acute abnormality of the cardiac silhouette. BONES AND SOFT TISSUES:  No acute osseous abnormality. IMPRESSION: 1. No acute findings. Electronically signed by: Pinkie Pebbles MD 06/29/2024 07:11 PM EST RP Workstation: [id number]    EKG: Independently reviewed.   Assessment/Plan  Chest pain with associated syncope -admit to progressive care  - continue to cycle CE  - echo in am  - f/u with further cardiology recs in am   Asthma /COPD O2 dependent  - resume home inhaler   DMII -iss/fs -resume metformin on d/c  HTN , uncontrolled  -resume home regimen  - hydrochlorothiazide,metoprolol  -per cards rec increase amlodipine to 10 mg daily  Psoriasis  -continue home treatment    DVT prophylaxis: heparin Code Status: full/ as discussed per patient wishes in event of cardiac arrest  Family Communication: Suttles,Eugene (Spouse) [phone] (Mobile)  Disposition Plan: full/ as discussed per patient wishes in event of cardiac arrest  Consults called: Cardiology Mallipeddi Admission status:    Camila DELENA Ned MD [facility]   If 7PM-7AM, please contact night-coverage [email or web address] Password [email or web address]  06/29/2024, 11:52 PM

## 2024-06-30 ENCOUNTER — Inpatient Hospital Stay (HOSPITAL_COMMUNITY)

## 2024-06-30 DIAGNOSIS — R079 Chest pain, unspecified: Secondary | ICD-10-CM | POA: Diagnosis not present

## 2024-06-30 DIAGNOSIS — I10 Essential (primary) hypertension: Secondary | ICD-10-CM | POA: Diagnosis not present

## 2024-06-30 DIAGNOSIS — R55 Syncope and collapse: Secondary | ICD-10-CM

## 2024-06-30 DIAGNOSIS — E1165 Type 2 diabetes mellitus with hyperglycemia: Secondary | ICD-10-CM | POA: Diagnosis not present

## 2024-06-30 DIAGNOSIS — J42 Unspecified chronic bronchitis: Secondary | ICD-10-CM | POA: Diagnosis not present

## 2024-06-30 DIAGNOSIS — J9611 Chronic respiratory failure with hypoxia: Secondary | ICD-10-CM

## 2024-06-30 LAB — CBC
HCT: 34.1 % — ABNORMAL LOW (ref 36.0–46.0)
HCT: 36.1 % (ref 36.0–46.0)
Hemoglobin: 11 g/dL — ABNORMAL LOW (ref 12.0–15.0)
Hemoglobin: 11.3 g/dL — ABNORMAL LOW (ref 12.0–15.0)
MCH: 31.7 pg (ref 26.0–34.0)
MCH: 31.7 pg (ref 26.0–34.0)
MCHC: 32.3 g/dL (ref 30.0–36.0)
MCHC: 31.3 g/dL (ref 30.0–36.0)
MCV: 98.3 fL (ref 80.0–100.0)
MCV: 101.4 fL — ABNORMAL HIGH (ref 80.0–100.0)
Platelets: 246 K/uL (ref 150–400)
Platelets: 268 K/uL (ref 150–400)
RBC: 3.47 MIL/uL — ABNORMAL LOW (ref 3.87–5.11)
RBC: 3.56 MIL/uL — ABNORMAL LOW (ref 3.87–5.11)
RDW: 12.7 % (ref 11.5–15.5)
RDW: 12.6 % (ref 11.5–15.5)
WBC: 6 K/uL (ref 4.0–10.5)
WBC: 7 K/uL (ref 4.0–10.5)
nRBC: 0 % (ref 0.0–0.2)
nRBC: 0 % (ref 0.0–0.2)

## 2024-06-30 LAB — ECHOCARDIOGRAM COMPLETE
Area-P 1/2: 3.42 cm2
S' Lateral: 2.3 cm

## 2024-06-30 LAB — URINALYSIS, COMPLETE (UACMP) WITH MICROSCOPIC
Bilirubin Urine: NEGATIVE
Glucose, UA: NEGATIVE mg/dL
Hgb urine dipstick: NEGATIVE
Ketones, ur: NEGATIVE mg/dL
Nitrite: NEGATIVE
Protein, ur: NEGATIVE mg/dL
Specific Gravity, Urine: 1.019 (ref 1.005–1.030)
pH: 7 (ref 5.0–8.0)

## 2024-06-30 LAB — COMPREHENSIVE METABOLIC PANEL WITH GFR
ALT: 27 U/L (ref 0–44)
AST: 27 U/L (ref 15–41)
Albumin: 3.6 g/dL (ref 3.5–5.0)
Alkaline Phosphatase: 67 U/L (ref 38–126)
Anion gap: 16 — ABNORMAL HIGH (ref 5–15)
BUN: 12 mg/dL (ref 8–23)
CO2: 27 mmol/L (ref 22–32)
Calcium: 9.2 mg/dL (ref 8.9–10.3)
Chloride: 102 mmol/L (ref 98–111)
Creatinine, Ser: 0.83 mg/dL (ref 0.44–1.00)
GFR, Estimated: >60 mL/min (ref >60)
Glucose, Bld: 172 mg/dL — ABNORMAL HIGH (ref 70–99)
Potassium: 3.8 mmol/L (ref 3.5–5.1)
Sodium: 145 mmol/L (ref 135–145)
Total Bilirubin: 0.2 mg/dL (ref 0.0–1.2)
Total Protein: 7 g/dL (ref 6.5–8.1)

## 2024-06-30 LAB — CREATININE, SERUM
Creatinine, Ser: 0.95 mg/dL (ref 0.44–1.00)
GFR, Estimated: >60 mL/min (ref >60)

## 2024-06-30 MED ORDER — HEPARIN SODIUM (PORCINE) 5000 UNIT/ML IJ SOLN
5000.0000 [IU] | Freq: Three times a day (TID) | INTRAMUSCULAR | Status: DC
  Administered 2024-06-30 – 2024-07-01 (×3): 5000 [IU] via SUBCUTANEOUS
  Filled 2024-06-30 (×4): qty 1

## 2024-06-30 MED ORDER — METOPROLOL TARTRATE 25 MG PO TABS
25.0000 mg | ORAL_TABLET | Freq: Two times a day (BID) | ORAL | Status: DC
  Administered 2024-06-30 – 2024-07-01 (×4): 25 mg via ORAL
  Filled 2024-06-30 (×4): qty 1

## 2024-06-30 MED ORDER — VENLAFAXINE HCL ER 75 MG PO CP24
150.0000 mg | ORAL_CAPSULE | Freq: Every day | ORAL | Status: DC
  Administered 2024-06-30 – 2024-07-01 (×2): 150 mg via ORAL
  Filled 2024-06-30 (×2): qty 2

## 2024-06-30 MED ORDER — CLOBETASOL PROPIONATE 0.05 % EX SOLN: Freq: Two times a day (BID) | CUTANEOUS | Status: DC

## 2024-06-30 MED ORDER — ACETAMINOPHEN 325 MG PO TABS
650.0000 mg | ORAL_TABLET | Freq: Four times a day (QID) | ORAL | Status: DC | PRN
  Administered 2024-06-30: 650 mg via ORAL
  Filled 2024-06-30: qty 2

## 2024-06-30 MED ORDER — AMLODIPINE BESYLATE 10 MG PO TABS
10.0000 mg | ORAL_TABLET | Freq: Every day | ORAL | Status: DC
  Administered 2024-06-30 – 2024-07-01 (×2): 10 mg via ORAL
  Filled 2024-06-30: qty 1
  Filled 2024-06-30: qty 2

## 2024-06-30 MED ORDER — ADULT MULTIVITAMIN W/MINERALS CH
1.0000 | ORAL_TABLET | Freq: Every day | ORAL | Status: DC
  Administered 2024-06-30 – 2024-07-01 (×2): 1 via ORAL
  Filled 2024-06-30 (×2): qty 1

## 2024-06-30 MED ORDER — IBUPROFEN 200 MG PO TABS
400.0000 mg | ORAL_TABLET | Freq: Four times a day (QID) | ORAL | Status: DC | PRN
Start: 2024-06-30 — End: 2024-07-01
  Administered 2024-06-30: 400 mg via ORAL
  Filled 2024-06-30: qty 1

## 2024-06-30 MED ORDER — ACETAMINOPHEN 650 MG RE SUPP: 650.0000 mg | Freq: Four times a day (QID) | RECTAL | Status: DC | PRN

## 2024-06-30 MED ORDER — UMECLIDINIUM BROMIDE 62.5 MCG/ACT IN AEPB
1.0000 | INHALATION_SPRAY | Freq: Every day | RESPIRATORY_TRACT | Status: DC
  Administered 2024-06-30 – 2024-07-01 (×2): 1 via RESPIRATORY_TRACT
  Filled 2024-06-30: qty 7

## 2024-06-30 MED ORDER — HYDROCHLOROTHIAZIDE 25 MG PO TABS
25.0000 mg | ORAL_TABLET | Freq: Every day | ORAL | Status: DC
  Administered 2024-06-30 – 2024-07-01 (×2): 25 mg via ORAL
  Filled 2024-06-30 (×2): qty 1

## 2024-06-30 MED ORDER — FLUTICASONE FUROATE-VILANTEROL 100-25 MCG/ACT IN AEPB
1.0000 | INHALATION_SPRAY | Freq: Every day | RESPIRATORY_TRACT | Status: DC
  Administered 2024-06-30 – 2024-07-01 (×2): 1 via RESPIRATORY_TRACT
  Filled 2024-06-30: qty 28

## 2024-06-30 MED ORDER — MONTELUKAST SODIUM 10 MG PO TABS
10.0000 mg | ORAL_TABLET | Freq: Every day | ORAL | Status: DC
  Administered 2024-06-30 – 2024-07-01 (×2): 10 mg via ORAL
  Filled 2024-06-30 (×2): qty 1

## 2024-06-30 MED ORDER — ONDANSETRON HCL 4 MG/2ML IJ SOLN
4.0000 mg | Freq: Four times a day (QID) | INTRAMUSCULAR | Status: DC | PRN
Start: 2024-06-30 — End: 2024-07-01

## 2024-06-30 MED ORDER — PRAVASTATIN SODIUM 10 MG PO TABS
20.0000 mg | ORAL_TABLET | Freq: Every day | ORAL | Status: DC
  Administered 2024-06-30: 20 mg via ORAL
  Filled 2024-06-30: qty 2

## 2024-06-30 MED ORDER — ALBUTEROL SULFATE (2.5 MG/3ML) 0.083% IN NEBU
2.5000 mg | INHALATION_SOLUTION | RESPIRATORY_TRACT | Status: DC | PRN
Start: 2024-06-30 — End: 2024-07-01

## 2024-06-30 MED ORDER — ONDANSETRON HCL 4 MG PO TABS: 4.0000 mg | ORAL_TABLET | Freq: Four times a day (QID) | ORAL | Status: DC | PRN

## 2024-06-30 MED ORDER — TRAMADOL HCL 50 MG PO TABS
50.0000 mg | ORAL_TABLET | Freq: Once | ORAL | Status: AC
  Administered 2024-06-30: 50 mg via ORAL
  Filled 2024-06-30: qty 1

## 2024-06-30 MED ORDER — OMEGA-3-ACID ETHYL ESTERS 1 G PO CAPS
1.0000 g | ORAL_CAPSULE | Freq: Every day | ORAL | Status: DC
  Administered 2024-06-30 – 2024-07-01 (×2): 1 g via ORAL
  Filled 2024-06-30 (×2): qty 1

## 2024-06-30 NOTE — Progress Notes (Signed)
  Echocardiogram 2D Echocardiogram has been performed.  Sandra Burnett 06/30/2024, 1:40 PM

## 2024-06-30 NOTE — Progress Notes (Signed)
   06/30/24 1236  Spiritual Encounters  Type of Visit Initial  Care provided to: Patient;Family  Conversation partners present during encounter Nurse  Referral source Patient request  Reason for visit Urgent spiritual support  OnCall Visit No   This chaplain visited the patient's room in ED but patient was sleeping. Patient's son was present and I spoke with him. He said his mom was not doing well and she needed a [employer] priest for the purpose of last rites. I gave him the contact information for contact.

## 2024-06-30 NOTE — ED Notes (Signed)
 Pt is anxious and anger with a family situation. Son is at bedside trying to comfort her. Nurse encouraging her to stay calm and rest.

## 2024-06-30 NOTE — ED Notes (Signed)
 Pt complaining of pain headache and has decline Tylenol, provider made aware.

## 2024-06-30 NOTE — ED Notes (Signed)
 Patient ambulated to restroom with steady gait. Back in bed and connected to monitor.

## 2024-06-30 NOTE — Progress Notes (Signed)
 Progress Note   Patient: Sandra Burnett FMW:[id number] DOB: [birth year] DOA: 06/29/2024     1 DOS: the patient was seen and examined on 06/30/2024   Brief hospital course: Sandra Burnett is a 78 y.o. female with medical history significant of Asthma, COPD O2 dependent, DMII, HTN, HLD,psoriasis,  who presents to ED with with complaint of chest pain that began around 3pm day of presentation.Patient states she has  had increase stress recently an the last few days she has had chest pain. Patient describes pain as substernal  aching pressure, with radiation to her shoulders that is associated with DOE and nausea. Patient course further complicated by episode of syncope en route with ambulance. Per EMS no seizure like activity, no change in pulse rate or blood pressure.  Patient denies any prodrome. Also reported a brief episode of unconsciousness in ED.   Patient is admitted for further management and cardiac evaluation.   Assessment and Plan: Chest pain with associated syncopal event- EKG unremarkable, telemetry uneventful. Echo reviewed by cardiology nonspecific. She complained of headache, started motrin as tylenol not helping. Check orthostatic vitals. If persistent symptoms consider CT head. Plan to discharge her tomorrow.  Asthma /COPD O2 dependent  Resume home inhaler, continue supplemental home O2.   Type 2 diabetes mellitus Continue accucheks, sliding scale. Hypoglycemia protocol. Resume metformin on d/c   Uncontrolled Hypertension Resumed home regimen, hydrochlorothiazide,metoprolol  Increased amlodipine to 10 mg daily per cardiology recommendations.   Psoriasis  Continue home treatment.  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 Low cal diet, weight reduction advised.     Out of bed to chair. Incentive spirometry. Nursing supportive care. Fall, aspiration precautions. Diet:  Diet Orders (From admission, onward)     Start     Ordered   06/30/24 0036  Diet heart healthy/carb  modified Room service appropriate? Yes; Fluid consistency: Thin  Diet effective now       Question Answer Comment  Diet-HS Snack? Nothing   Room service appropriate? Yes   Fluid consistency: Thin      06/30/24 0039           DVT prophylaxis: heparin injection 5,000 Units Start: 06/30/24 1400  Level of care: Progressive   Code Status: Full Code  Subjective: Patient is seen and examined today morning. She complained of headache. RN noted more agitation, likely from family conflict. Denied anymore chest pain or syncopal episodes.  Physical Exam: Vitals:   06/30/24 1730 06/30/24 1735 06/30/24 1740 06/30/24 1746  BP:    (!) 153/68  Pulse:    89  Resp: 16 15 18  (!) 23  Temp:    98.3 F (36.8 C)  TempSrc:    Oral  SpO2:    95%  Weight:    82.7 kg  Height:    5' 4 (1.626 m)    General - Elderly Caucasian female, no apparent distress HEENT - PERRLA, EOMI, atraumatic head, non tender sinuses. Lung - Clear, no rales, rhonchi, wheezes. Heart - S1, S2 heard, no murmurs, rubs, trace pedal edema. Abdomen - Soft, non tender, bowel sounds good Neuro - Alert, awake and oriented x 3, non focal exam. Skin - Warm and dry.  Data Reviewed:      Latest Ref Rng & Units 06/30/2024    4:00 AM 06/30/2024   12:31 AM 06/29/2024    6:35 PM  CBC  WBC 4.0 - 10.5 K/uL 7.0  6.0    Hemoglobin 12.0 - 15.0 g/dL 88.6  88.9  11.9  Hematocrit 36.0 - 46.0 % 36.1  34.1  35.0   Platelets 150 - 400 K/uL 268  246        Latest Ref Rng & Units 06/30/2024    4:00 AM 06/30/2024   12:31 AM 06/29/2024    6:35 PM  BMP  Glucose 70 - 99 mg/dL 827   852   BUN 8 - 23 mg/dL 12   14   Creatinine 9.55 - 1.00 mg/dL 9.16  9.04  8.99   Sodium 135 - 145 mmol/L 145   142   Potassium 3.5 - 5.1 mmol/L 3.8   3.3   Chloride 98 - 111 mmol/L 102   102   CO2 22 - 32 mmol/L 27     Calcium 8.9 - 10.3 mg/dL 9.2      ECHOCARDIOGRAM COMPLETE Result Date: 06/30/2024    ECHOCARDIOGRAM REPORT   Patient Name:   Sandra Burnett Date of Exam: 06/30/2024 Medical Rec #:  [id number]      Height:       65.0 in Accession #:    [id number]     Weight:       190.4 lb Date of Birth:  1945/09/22       BSA:          1.937 m Patient Age:    78 years       BP:           135/112 mmHg Patient Gender: F              HR:           69 bpm. Exam Location:  Inpatient Procedure: 2D Echo (Both Spectral and Color Flow Doppler were utilized during            procedure). Indications:    syncope  History:        Patient has no prior history of Echocardiogram examinations.                 COPD; Risk Factors:Hypertension, Diabetes, Dyslipidemia and                 Former Smoker.  Sonographer:    Tinnie Barefoot RDCS Referring Phys: [id number] SARA-MAIZ A THOMAS IMPRESSIONS  1. Left ventricular ejection fraction, by estimation, is 65 to 70%. The left ventricle has normal function. The left ventricle has no regional wall motion abnormalities. There is mild left ventricular hypertrophy. Left ventricular diastolic parameters are consistent with Grade I diastolic dysfunction (impaired relaxation).  2. Right ventricular systolic function is normal. The right ventricular size is normal. Tricuspid regurgitation signal is inadequate for assessing PA pressure.  3. The mitral valve is degenerative. Trivial mitral valve regurgitation. Moderate mitral annular calcification.  4. The aortic valve was not well visualized. Aortic valve regurgitation is not visualized. No aortic stenosis is present.  5. The inferior vena cava is normal in size with greater than 50% respiratory variability, suggesting right atrial pressure of 3 mmHg. FINDINGS  Left Ventricle: Left ventricular ejection fraction, by estimation, is 65 to 70%. The left ventricle has normal function. The left ventricle has no regional wall motion abnormalities. The left ventricular internal cavity size was normal in size. There is  mild left ventricular hypertrophy. Left ventricular diastolic parameters are consistent  with Grade I diastolic dysfunction (impaired relaxation). Right Ventricle: The right ventricular size is normal. No increase in right ventricular wall thickness. Right ventricular systolic function is normal. Tricuspid regurgitation signal is inadequate for assessing  PA pressure. Left Atrium: Left atrial size was normal in size. Right Atrium: Right atrial size was normal in size. Pericardium: There is no evidence of pericardial effusion. Mitral Valve: The mitral valve is degenerative in appearance. Moderate mitral annular calcification. Trivial mitral valve regurgitation. Tricuspid Valve: The tricuspid valve is normal in structure. Tricuspid valve regurgitation is trivial. Aortic Valve: The aortic valve was not well visualized. Aortic valve regurgitation is not visualized. No aortic stenosis is present. Pulmonic Valve: The pulmonic valve was not well visualized. Pulmonic valve regurgitation is trivial. Aorta: The aortic root and ascending aorta are structurally normal, with no evidence of dilitation. Venous: The inferior vena cava is normal in size with greater than 50% respiratory variability, suggesting right atrial pressure of 3 mmHg. IAS/Shunts: The interatrial septum was not well visualized.  LEFT VENTRICLE PLAX 2D LVIDd:         4.30 cm   Diastology LVIDs:         2.30 cm   LV e' medial:    7.18 cm/s LV PW:         1.20 cm   LV E/e' medial:  10.9 LV IVS:        0.90 cm   LV e' lateral:   8.59 cm/s LVOT diam:     2.00 cm   LV E/e' lateral: 9.1 LV SV:         79 LV SV Index:   41 LVOT Area:     3.14 cm  RIGHT VENTRICLE             IVC RV Basal diam:  2.60 cm     IVC diam: 1.40 cm RV S prime:     11.90 cm/s TAPSE (M-mode): 1.6 cm LEFT ATRIUM             Index        RIGHT ATRIUM           Index LA diam:        4.00 cm 2.06 cm/m   RA Area:     12.90 cm LA Vol (A2C):   45.0 ml 23.23 ml/m  RA Volume:   29.40 ml  15.18 ml/m LA Vol (A4C):   44.2 ml 22.82 ml/m LA Biplane Vol: 44.6 ml 23.02 ml/m  AORTIC VALVE  LVOT Vmax:   112.00 cm/s LVOT Vmean:  75.800 cm/s LVOT VTI:    0.252 m  AORTA Ao Root diam: 3.00 cm Ao Asc diam:  3.30 cm MITRAL VALVE MV Area (PHT): 3.42 cm     SHUNTS MV Decel Time: 222 msec     Systemic VTI:  0.25 m MV E velocity: 78.50 cm/s   Systemic Diam: 2.00 cm MV A velocity: 127.00 cm/s MV E/A ratio:  0.62 Lonni Nanas MD Electronically signed by Lonni Nanas MD Signature Date/Time: 06/30/2024/3:57:56 PM    Final    CT Angio Chest PE W and/or Wo Contrast Result Date: 06/29/2024 EXAM: CTA CHEST 06/29/2024 07:14:29 PM TECHNIQUE: CTA of the chest was performed without and with the administration of 75 mL of intravenous iohexol  (OMNIPAQUE ) 350 MG/ML injection. Multiplanar reformatted images are provided for review. MIP images are provided for review. Automated exposure control, iterative reconstruction, and/or weight based adjustment of the mA/kV was utilized to reduce the radiation dose to as low as reasonably achievable. COMPARISON: None available. CLINICAL HISTORY: Chest pain. LOC. Pulmonary embolism (PE) suspected, high prob. FINDINGS: PULMONARY ARTERIES: Pulmonary arteries are adequately opacified for evaluation. No acute pulmonary embolus. Main  pulmonary artery is normal in caliber. MEDIASTINUM: The heart and pericardium demonstrate no acute abnormality. No thoracic aortic aneurysm or dissection. Thoracic aortic atherosclerosis. LYMPH NODES: No mediastinal, hilar or axillary lymphadenopathy. LUNGS AND PLEURA: Mild centrilobular emphysematous changes. Faint perifissural nodules along the right major fissure (image 69) reflect benign subpleural lymph nodes; no follow-up is recommended. Mild lingular atelectasis. No focal consolidation or pulmonary edema. No evidence of pleural effusion or pneumothorax. UPPER ABDOMEN: Limited images of the upper abdomen are unremarkable. SOFT TISSUES AND BONES: Mild degenerative changes of the mid thoracic spine. No acute soft tissue abnormality.  IMPRESSION: 1. No pulmonary embolism. 2. No acute findings. Electronically signed by: Pinkie Pebbles MD 06/29/2024 07:33 PM EST RP Workstation: [id number]   DG Chest Port 1 View Result Date: 06/29/2024 EXAM: 1 VIEW(S) XRAY OF THE CHEST 06/29/2024 06:43:00 PM COMPARISON: 08/20/2009 CLINICAL HISTORY: cp loc FINDINGS: LUNGS AND PLEURA: No focal pulmonary opacity. No pleural effusion. No pneumothorax. HEART AND MEDIASTINUM: Thoracic aortic atherosclerosis. No acute abnormality of the cardiac silhouette. BONES AND SOFT TISSUES: No acute osseous abnormality. IMPRESSION: 1. No acute findings. Electronically signed by: Pinkie Pebbles MD 06/29/2024 07:11 PM EST RP Workstation: [id number]    Family Communication: Discussed with patient, family at bedside. They understand and agree. All questions answered.  Disposition: Status is: Inpatient Remains inpatient appropriate because: syncope work up  Planned Discharge Destination: Home     Time spent: 44 minutes  Author: Concepcion Riser, MD 06/30/2024 6:09 PM Secure chat 7am to 7pm For on call review [email or web address].

## 2024-06-30 NOTE — Progress Notes (Signed)
 Progress Note  Patient Name: Sandra Burnett Date of Encounter: 06/30/2024  Primary Cardiologist:   None   Subjective   She denies chest pain.  She has had a couple of episodes of transient AMS.  She is very stressed.  She has complained of headache.  Inpatient Medications    Scheduled Meds:  amLODipine  10 mg Oral Daily   clobetasol   Topical BID   fluticasone furoate-vilanterol  1 puff Inhalation Daily   heparin  5,000 Units Subcutaneous Q8H   hydrochlorothiazide  25 mg Oral Daily   ipratropium-albuterol  3 mL Nebulization Once   metoprolol tartrate  25 mg Oral BID   montelukast  10 mg Oral Daily   multivitamin with minerals  1 tablet Oral Daily   omega-3 acid ethyl esters  1 g Oral Daily   ondansetron (ZOFRAN) IV  4 mg Intravenous Once   pravastatin  20 mg Oral q1800   umeclidinium bromide  1 puff Inhalation Daily   venlafaxine XR  150 mg Oral Q breakfast   Continuous Infusions:  PRN Meds: acetaminophen **OR** acetaminophen, albuterol, ibuprofen, nitroGLYCERIN , ondansetron **OR** ondansetron (ZOFRAN) IV   Vital Signs    Vitals:   06/30/24 0853 06/30/24 0902 06/30/24 1045 06/30/24 1130  BP: (!) 143/76 (!) 145/112 (!) 133/103 (!) 135/112  Pulse: 70 74 73 73  Resp: 18  14 19   Temp: 98.3 F (36.8 C)     TempSrc: Oral     SpO2: 100%  100% 100%   No intake or output data in the 24 hours ending 06/30/24 1158 There were no vitals filed for this visit.  Telemetry    NSR - Personally Reviewed  ECG    NA - Personally Reviewed  Physical Exam   GEN: No acute distress.   Neck: No  JVD Cardiac: RRR, no murmurs, rubs, or gallops.  Respiratory: Clear  to auscultation bilaterally. GI: Soft, nontender, non-distended  MS: No  edema; No deformity. Neuro:  Nonfocal  Psych: Normal affect   Labs    Chemistry Recent Labs  Lab 06/29/24 1820 06/29/24 1835 06/30/24 0031 06/30/24 0400  NA 142 142  --  145  K 3.5 3.3*  --  3.8  CL 101 102  --  102  CO2 28   --   --  27  GLUCOSE 143* 147*  --  172*  BUN 11 14  --  12  CREATININE 0.84 1.00 0.95 0.83  CALCIUM 9.1  --   --  9.2  PROT 7.5  --   --  7.0  ALBUMIN 3.8  --   --  3.6  AST 27  --   --  27  ALT 28  --   --  27  ALKPHOS 70  --   --  67  BILITOT 0.3  --   --  0.2  GFRNONAA >60  --  >60 >60  ANIONGAP 13  --   --  16*     Hematology Recent Labs  Lab 06/29/24 1820 06/29/24 1835 06/30/24 0031 06/30/24 0400  WBC 6.3  --  6.0 7.0  RBC 3.61*  --  3.47* 3.56*  HGB 11.6* 11.9* 11.0* 11.3*  HCT 35.8* 35.0* 34.1* 36.1  MCV 99.2  --  98.3 101.4*  MCH 32.1  --  31.7 31.7  MCHC 32.4  --  32.3 31.3  RDW 12.7  --  12.7 12.6  PLT 261  --  246 268    Cardiac  EnzymesNo results for input(s): TROPONINI in the last 168 hours. No results for input(s): TROPIPOC in the last 168 hours.   BNPNo results for input(s): BNP, PROBNP in the last 168 hours.   DDimer No results for input(s): DDIMER in the last 168 hours.   Radiology    CT Angio Chest PE W and/or Wo Contrast Result Date: 06/29/2024 EXAM: CTA CHEST 06/29/2024 07:14:29 PM TECHNIQUE: CTA of the chest was performed without and with the administration of 75 mL of intravenous iohexol  (OMNIPAQUE ) 350 MG/ML injection. Multiplanar reformatted images are provided for review. MIP images are provided for review. Automated exposure control, iterative reconstruction, and/or weight based adjustment of the mA/kV was utilized to reduce the radiation dose to as low as reasonably achievable. COMPARISON: None available. CLINICAL HISTORY: Chest pain. LOC. Pulmonary embolism (PE) suspected, high prob. FINDINGS: PULMONARY ARTERIES: Pulmonary arteries are adequately opacified for evaluation. No acute pulmonary embolus. Main pulmonary artery is normal in caliber. MEDIASTINUM: The heart and pericardium demonstrate no acute abnormality. No thoracic aortic aneurysm or dissection. Thoracic aortic atherosclerosis. LYMPH NODES: No mediastinal, hilar or axillary  lymphadenopathy. LUNGS AND PLEURA: Mild centrilobular emphysematous changes. Faint perifissural nodules along the right major fissure (image 69) reflect benign subpleural lymph nodes; no follow-up is recommended. Mild lingular atelectasis. No focal consolidation or pulmonary edema. No evidence of pleural effusion or pneumothorax. UPPER ABDOMEN: Limited images of the upper abdomen are unremarkable. SOFT TISSUES AND BONES: Mild degenerative changes of the mid thoracic spine. No acute soft tissue abnormality. IMPRESSION: 1. No pulmonary embolism. 2. No acute findings. Electronically signed by: Pinkie Pebbles MD 06/29/2024 07:33 PM EST RP Workstation: [id number]   DG Chest Port 1 View Result Date: 06/29/2024 EXAM: 1 VIEW(S) XRAY OF THE CHEST 06/29/2024 06:43:00 PM COMPARISON: 08/20/2009 CLINICAL HISTORY: cp loc FINDINGS: LUNGS AND PLEURA: No focal pulmonary opacity. No pleural effusion. No pneumothorax. HEART AND MEDIASTINUM: Thoracic aortic atherosclerosis. No acute abnormality of the cardiac silhouette. BONES AND SOFT TISSUES: No acute osseous abnormality. IMPRESSION: 1. No acute findings. Electronically signed by: Pinkie Pebbles MD 06/29/2024 07:11 PM EST RP Workstation: [id number]    Cardiac Studies   Echo pending  Patient Profile     78 y.o. female known to have COPD/asthma, HTN, DM 2, psoriasis, presented to the ER with chest pain.    Assessment & Plan    Chest pain:    She has had recent coronary CTA.  Enzymes are negative.  EKG on admission demonstrated no QRS is widened but there are no acute changes .  No recent EKG for comparison.  Echo is pending.  No further workup pending this result.      HTN, poorly controlled: Blood pressure is not well-controlled.  However, the amlodipine was just increased this admission so I think we should continue the current meds and have her keep a blood pressure diary at home.  She could also be sent home with as needed hydralazine for systolics greater  than 1 8010 mg.  COPD/asthma: Per the primary team.  AMS: She apparently had some transient altered mental status in the ambulance but I do not see an EMS run sheet.  There was an episode apparently in the ER but I do not see any mention of this.  Workup per the primary team.   For questions or updates, please contact [facility] Please consult [email or web address] for contact info under Cardiology/STEMI.   Signed, Lynwood Schilling, MD  06/30/2024, 11:58 AM

## 2024-07-01 DIAGNOSIS — I10 Essential (primary) hypertension: Secondary | ICD-10-CM | POA: Diagnosis not present

## 2024-07-01 DIAGNOSIS — R079 Chest pain, unspecified: Secondary | ICD-10-CM | POA: Diagnosis not present

## 2024-07-01 DIAGNOSIS — R4182 Altered mental status, unspecified: Secondary | ICD-10-CM | POA: Diagnosis not present

## 2024-07-01 DIAGNOSIS — R55 Syncope and collapse: Secondary | ICD-10-CM | POA: Diagnosis not present

## 2024-07-01 MED ORDER — NITROGLYCERIN 0.4 MG SL SUBL: 0.4000 mg | SUBLINGUAL_TABLET | SUBLINGUAL | 0 refills | Status: DC | PRN

## 2024-07-01 MED ORDER — INFLUENZA VAC SPLIT HIGH-DOSE 0.5 ML IM SUSY
0.5000 mL | PREFILLED_SYRINGE | Freq: Once | INTRAMUSCULAR | Status: AC
  Administered 2024-07-01: 0.5 mL via INTRAMUSCULAR
  Filled 2024-07-01: qty 0.5

## 2024-07-01 MED ORDER — OMEGA-3-ACID ETHYL ESTERS 1 G PO CAPS: 1.0000 g | ORAL_CAPSULE | Freq: Every day | ORAL | 1 refills | Status: AC

## 2024-07-01 MED ORDER — PNEUMOCOCCAL 20-VAL CONJ VACC 0.5 ML IM SUSY
0.5000 mL | PREFILLED_SYRINGE | Freq: Once | INTRAMUSCULAR | Status: AC
  Administered 2024-07-01: 0.5 mL via INTRAMUSCULAR
  Filled 2024-07-01: qty 0.5

## 2024-07-01 MED ORDER — HYDROCORTISONE 1 % EX CREA
1.0000 | TOPICAL_CREAM | Freq: Two times a day (BID) | CUTANEOUS | Status: DC
  Administered 2024-07-01: 1 via TOPICAL
  Filled 2024-07-01: qty 28

## 2024-07-01 MED ORDER — HYDROCORTISONE 1 % EX CREA: 1.0000 | TOPICAL_CREAM | Freq: Two times a day (BID) | CUTANEOUS | 0 refills | Status: AC

## 2024-07-01 MED ORDER — AMLODIPINE BESYLATE 10 MG PO TABS: 10.0000 mg | ORAL_TABLET | Freq: Every day | ORAL | 1 refills | Status: AC

## 2024-07-01 MED ORDER — PNEUMOCOCCAL 20-VAL CONJ VACC 0.5 ML IM SUSY: 0.5000 mL | PREFILLED_SYRINGE | INTRAMUSCULAR | Status: DC

## 2024-07-01 MED ORDER — FLUTICASONE FUROATE-VILANTEROL 100-25 MCG/ACT IN AEPB: 1.0000 | INHALATION_SPRAY | Freq: Every day | RESPIRATORY_TRACT | 1 refills | Status: AC

## 2024-07-01 MED ORDER — UMECLIDINIUM BROMIDE 62.5 MCG/ACT IN AEPB: 1.0000 | INHALATION_SPRAY | Freq: Every day | RESPIRATORY_TRACT | 1 refills | Status: AC

## 2024-07-01 MED ORDER — CLOBETASOL PROPIONATE 0.05 % EX SOLN: Freq: Two times a day (BID) | CUTANEOUS | 0 refills | Status: DC

## 2024-07-01 MED ORDER — INFLUENZA VAC SPLIT HIGH-DOSE 0.5 ML IM SUSY: 0.5000 mL | PREFILLED_SYRINGE | INTRAMUSCULAR | Status: DC

## 2024-07-01 NOTE — Care Management Obs Status (Signed)
 MEDICARE OBSERVATION STATUS NOTIFICATION   Patient Details  Name: Sandra Burnett MRN: [id number] Date of Birth: [birth year]   Medicare Observation Status Notification Given:  Yes    Marval Gell, RN 07/01/2024, 9:07 AM

## 2024-07-01 NOTE — Plan of Care (Signed)
   Problem: Education: Goal: Knowledge of General Education information will improve Description: Including pain rating scale, medication(s)/side effects and non-pharmacologic comfort measures Outcome: Progressing   Problem: Clinical Measurements: Goal: Cardiovascular complication will be avoided Outcome: Progressing

## 2024-07-01 NOTE — TOC CM/SW Note (Addendum)
 Transition of Care (TOC) CM/SW Note    CSW received consult for patient. CSW followed up with patient. Patient had some questions regarding medication cost. CSW informed patient that CSW will inform Debbie CM to help assist. Patient thanked CSW. CSW informed Debbie CM.

## 2024-07-01 NOTE — TOC Transition Note (Signed)
 Transition of Care [facility]) - Discharge Note   Patient Details  Name: Sandra Burnett MRN: [id number] Date of Birth: 13-Apr-1946  Transition of [facility] CM/SW Contact:  Marval Gell, RN Phone Number: 07/01/2024, 3:31 PM   Clinical Narrative:     Spoke to patient about consult. She states that she pays $600 a month for Ohtuvayre . She states she has spoken to OP provider and there are no samples, but she understands that there may be Rx assistance for it in January. She feels it works best and does not want to quit taking it. I have requested Dr Rosario to speak with the patient to explain his recommendations for Rx changes noted on AVS.  I have advised her to follow the Rx as written on her AVS after DC as the attending has prescribed, this has her stopping this medication, starting others. I have advised against continuing past medications and concurrently starting new ones.  She states that she wants to continue it and will not be picking up her new scripts until she speaks with her primary care tomorrow morning, she has his personal cell phone and will call in the morning.    Final next level of care: Home/Self Care Barriers to Discharge: No Barriers Identified   Patient Goals and CMS Choice            Discharge Placement                       Discharge Plan and Services Additional resources added to the After Visit Summary for                                       Social Drivers of Health (SDOH) Interventions SDOH Screenings   Food Insecurity: No Food Insecurity (06/30/2024)  Housing: Low Risk  (06/30/2024)  Transportation Needs: No Transportation Needs (06/30/2024)  Utilities: Not At Risk (06/30/2024)  Financial Resource Strain: Low Risk  (04/30/2024)   Received from [facility]  Social Connections: Moderately Integrated (06/30/2024)  Tobacco Use: Medium Risk (05/14/2024)   Received from [facility]     Readmission  Risk Interventions     No data to display

## 2024-07-01 NOTE — Discharge Summary (Signed)
 Physician Discharge Summary  Patient ID: Sandra Burnett MRN: [id number] DOB/AGE: 01/11/1946 78 y.o.  Admit date: 06/29/2024 Discharge date: 07/01/2024  Admission Diagnoses:  Discharge Diagnoses:  Principal Problem:   Cardiac related syncope Active Problems:   Diabetes mellitus, type 2 (HCC)   Uncontrolled hypertension   Psoriasis   Chronic respiratory failure with hypoxia (HCC)   COPD with asthma (HCC)   Discharged Condition: stable  Hospital Course: Patient is a 78 year old female past medical history significant for COPD on oxygen , asthma, diabetes mellitus type 2, cirrhosis, hypertension and hyperlipidemia.  Patient was admitted with chest pain.  Chest pain was atypical.  Patient was admitted for further assessment and management.  Troponins came back negative.  Cardiology team was consulted to assist with patient's management.  Echocardiogram is as documented above.  Cardiology team has cleared patient for discharge.  Patient will follow-up primary care provider and cardiology team on discharge.  Chest pain: - Atypical. - Negative troponin. - EKG was not suggestive of acute coronary syndrome. - Syncopal symptom was reported when patient was given sublingual nitro. - Chest pain and syncope have resolved. - Cardiology team was consulted to assist with patient's management. - Cardiology team has cleared patient for discharge. - Patient will follow-up primary care provider and cardiology team on discharge.   Asthma /COPD O2 dependent: -Stable.   Type 2 diabetes mellitus: -Further management per primary care provider.   Uncontrolled Hypertension: -Continue home medications. - Hydralazine as needed for systolic blood pressure greater than 170 mmHg. - Goal blood pressure should be less than 130/80 mmHg.     Psoriasis: Continue home treatment.   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 -Diet and exercise. - Primary care provider may consider subcutaneous semaglutide or Mounjaro i.e.  if not contraindicated.   Consults: cardiology  Significant Diagnostic Studies:  -Troponin was negative (5-8).  CT angio chest revealed: 1. No pulmonary embolism. 2. No acute findings.  Echo revealed: 1. Left ventricular ejection fraction, by estimation, is 65 to 70%. The  left ventricle has normal function. The left ventricle has no regional  wall motion abnormalities. There is mild left ventricular hypertrophy.  Left ventricular diastolic parameters  are consistent with Grade I diastolic dysfunction (impaired relaxation).   2. Right ventricular systolic function is normal. The right ventricular  size is normal. Tricuspid regurgitation signal is inadequate for assessing  PA pressure.   3. The mitral valve is degenerative. Trivial mitral valve regurgitation.  Moderate mitral annular calcification.   4. The aortic valve was not well visualized. Aortic valve regurgitation  is not visualized. No aortic stenosis is present.   5. The inferior vena cava is normal in size with greater than 50%  respiratory variability, suggesting right atrial pressure of 3 mmHg.       Discharge Exam: Blood pressure (!) 140/72, pulse 81, temperature 98.7 F (37.1 C), temperature source Oral, resp. rate 20, height 5' 4 (1.626 m), weight 82.7 kg, SpO2 95%.   Disposition: Discharge disposition: 01-Home or Self Care       Discharge Instructions     Diet - low sodium heart healthy   Complete by: As directed    Increase activity slowly   Complete by: As directed       Allergies as of 07/01/2024       Reactions   Lisinopril Other (See Comments)   Severe hypotension   Other Anaphylaxis, Other (See Comments)   Yellow dye Elevated LFTs Yellow Dye   Nitroglycerin  Other (See Comments)  Nitroglycerin  gives this patient episodes of syncope 5-10 minutes following administration.   Codeine Nausea And Vomiting, Nausea Only   Other reaction(s): Vomiting Other reaction(s): GI Intolerance         Medication List     STOP taking these medications    clobetasol 0.05 % external solution Commonly known as: TEMOVATE   Fish Oil 1000 MG Caps Replaced by: omega-3 acid ethyl esters 1 g capsule   Fluticasone-Salmeterol 100-50 MCG/DOSE Aepb Commonly known as: ADVAIR Replaced by: fluticasone furoate-vilanterol 100-25 MCG/ACT Aepb   gabapentin 100 MG capsule Commonly known as: NEURONTIN   loratadine 10 MG tablet Commonly known as: CLARITIN   meloxicam 15 MG tablet Commonly known as: MOBIC   Ohtuvayre  3 MG/2.5ML Susp Generic drug: Ensifentrine    OXYGEN    senna-docusate 8.6-50 MG tablet Commonly known as: Senokot-S   tiotropium 18 MCG inhalation capsule Commonly known as: SPIRIVA Replaced by: umeclidinium bromide 62.5 MCG/ACT Aepb       TAKE these medications    albuterol 108 (90 Base) MCG/ACT inhaler Commonly known as: VENTOLIN HFA Inhale 1-2 puffs into the lungs every 4 (four) hours as needed for shortness of breath or wheezing.   amLODipine 10 MG tablet Commonly known as: NORVASC Take 1 tablet (10 mg total) by mouth daily. Start taking on: July 02, 2024 What changed:  medication strength how much to take   aspirin EC 81 MG tablet Take 81 mg by mouth daily.   EPINEPHrine 0.3 mg/0.3 mL Soaj injection Commonly known as: EPI-PEN Inject 0.3 mg into the muscle once.   fluticasone furoate-vilanterol 100-25 MCG/ACT Aepb Commonly known as: BREO ELLIPTA Inhale 1 puff into the lungs daily. Start taking on: July 02, 2024 Replaces: Fluticasone-Salmeterol 100-50 MCG/DOSE Aepb   hydrochlorothiazide 25 MG tablet Commonly known as: HYDRODIURIL Take 25 mg by mouth daily.   hydrocortisone cream 1 % Apply 1 Application topically 2 (two) times daily.   lovastatin 40 MG tablet Commonly known as: MEVACOR Take 1 tablet by mouth daily.   metoprolol tartrate 25 MG tablet Commonly known as: LOPRESSOR Take 25 mg by mouth 2 (two) times daily.    montelukast 10 MG tablet Commonly known as: SINGULAIR Take 10 mg by mouth daily.   Mounjaro 5 MG/0.5ML Pen Generic drug: tirzepatide Inject 5 mg into the skin once a week. What changed: Another medication with the same name was removed. Continue taking this medication, and follow the directions you see here.   MULTIVITAMINS PO Take 1 capsule by mouth daily.   omega-3 acid ethyl esters 1 g capsule Commonly known as: LOVAZA Take 1 capsule (1 g total) by mouth daily. Start taking on: July 02, 2024 Replaces: Fish Oil 1000 MG Caps   umeclidinium bromide 62.5 MCG/ACT Aepb Commonly known as: INCRUSE ELLIPTA Inhale 1 puff into the lungs daily. Start taking on: July 02, 2024 Replaces: tiotropium 18 MCG inhalation capsule   venlafaxine XR 150 MG 24 hr capsule Commonly known as: EFFEXOR-XR Take 150 mg by mouth daily with breakfast.        Follow-up Information     Lucien Orren SAILOR, PA-C Follow up.   Specialty: Cardiology Why: Hospital follow-up with Cardiology scheduled for 07/11/2024 at 10:55am. Please arrive 20 minutes early for check-in. If this date/ time does not work for you, please callour office to reschedule. Contact information: [address] [place] [address] [phone]         Fernande Ophelia PARAS III, MD Follow up in 1 week(s).   Specialty:  Internal Medicine Contact information: [address] [place] [address] [phone]                Time spent: 35 minutes.  SignedBETHA Leatrice LILLETTE Rosario 07/01/2024, 3:17 PM

## 2024-07-01 NOTE — Care Management CC44 (Signed)
 Condition Code 44 Documentation Completed  Patient Details  Name: Hazelene JESALYN FINAZZO MRN: [id number] Date of Birth: [birth year]   Condition Code 44 given:  Yes Patient signature on Condition Code 44 notice:  Yes Documentation of 2 MD's agreement:  Yes Code 44 added to claim:  Yes    Marval Gell, RN 07/01/2024, 9:07 AM

## 2024-07-01 NOTE — Progress Notes (Signed)
 Progress Note  Patient Name: Sandra Burnett Hacker Date of Encounter: 07/01/2024  Primary Cardiologist:   None   Subjective   No further episodes of AMS  Inpatient Medications    Scheduled Meds:  amLODipine  10 mg Oral Daily   clobetasol   Topical BID   fluticasone furoate-vilanterol  1 puff Inhalation Daily   heparin  5,000 Units Subcutaneous Q8H   hydrochlorothiazide  25 mg Oral Daily   hydrocortisone cream  1 Application Topical BID   [START ON 07/02/2024] Influenza vac split trivalent PF  0.5 mL Intramuscular Tomorrow-1000   ipratropium-albuterol  3 mL Nebulization Once   metoprolol tartrate  25 mg Oral BID   montelukast  10 mg Oral Daily   multivitamin with minerals  1 tablet Oral Daily   omega-3 acid ethyl esters  1 g Oral Daily   ondansetron (ZOFRAN) IV  4 mg Intravenous Once   [START ON 07/02/2024] pneumococcal 20-valent conjugate vaccine  0.5 mL Intramuscular Tomorrow-1000   pravastatin  20 mg Oral q1800   umeclidinium bromide  1 puff Inhalation Daily   venlafaxine XR  150 mg Oral Q breakfast   Continuous Infusions:  PRN Meds: acetaminophen **OR** acetaminophen, albuterol, ibuprofen, nitroGLYCERIN , ondansetron **OR** ondansetron (ZOFRAN) IV   Vital Signs    Vitals:   07/01/24 0450 07/01/24 0721 07/01/24 0926 07/01/24 1131  BP: (!) 141/76 (!) 156/71 (!) 156/71 (!) 140/72  Pulse: 63 72 72 81  Resp: 18 16  20   Temp: 97.8 F (36.6 C) 97.6 F (36.4 C)  98.7 F (37.1 C)  TempSrc: Oral Oral  Oral  SpO2: 100% 100%  95%  Weight:      Height:        Intake/Output Summary (Last 24 hours) at 07/01/2024 1143 Last data filed at 07/01/2024 0913 Gross per 24 hour  Intake 100 ml  Output --  Net 100 ml   Filed Weights   06/30/24 1711 06/30/24 1746  Weight: 82.7 kg 82.7 kg    Telemetry    NSR - Personally Reviewed  ECG    NA - Personally Reviewed  Physical Exam   GEN: No acute distress.   Neck: No  JVD Cardiac: RRR, no murmurs, rubs, or gallops.   Respiratory: Clear  to auscultation bilaterally. GI: Soft, nontender, non-distended  MS: No  edema; No deformity. Neuro:  Nonfocal  Psych: Normal affect   Labs    Chemistry Recent Labs  Lab 06/29/24 1820 06/29/24 1835 06/30/24 0031 06/30/24 0400  NA 142 142  --  145  K 3.5 3.3*  --  3.8  CL 101 102  --  102  CO2 28  --   --  27  GLUCOSE 143* 147*  --  172*  BUN 11 14  --  12  CREATININE 0.84 1.00 0.95 0.83  CALCIUM 9.1  --   --  9.2  PROT 7.5  --   --  7.0  ALBUMIN 3.8  --   --  3.6  AST 27  --   --  27  ALT 28  --   --  27  ALKPHOS 70  --   --  67  BILITOT 0.3  --   --  0.2  GFRNONAA >60  --  >60 >60  ANIONGAP 13  --   --  16*     Hematology Recent Labs  Lab 06/29/24 1820 06/29/24 1835 06/30/24 0031 06/30/24 0400  WBC 6.3  --  6.0 7.0  RBC 3.61*  --  3.47* 3.56*  HGB 11.6* 11.9* 11.0* 11.3*  HCT 35.8* 35.0* 34.1* 36.1  MCV 99.2  --  98.3 101.4*  MCH 32.1  --  31.7 31.7  MCHC 32.4  --  32.3 31.3  RDW 12.7  --  12.7 12.6  PLT 261  --  246 268    Cardiac EnzymesNo results for input(s): TROPONINI in the last 168 hours. No results for input(s): TROPIPOC in the last 168 hours.   BNPNo results for input(s): BNP, PROBNP in the last 168 hours.   DDimer No results for input(s): DDIMER in the last 168 hours.   Radiology    ECHOCARDIOGRAM COMPLETE Result Date: 06/30/2024    ECHOCARDIOGRAM REPORT   Patient Name:   CHRISTIE VISCOMI Date of Exam: 06/30/2024 Medical Rec #:  [id number]      Height:       65.0 in Accession #:    [id number]     Weight:       190.4 lb Date of Birth:  1946/05/06       BSA:          1.937 m Patient Age:    78 years       BP:           135/112 mmHg Patient Gender: F              HR:           69 bpm. Exam Location:  Inpatient Procedure: 2D Echo (Both Spectral and Color Flow Doppler were utilized during            procedure). Indications:    syncope  History:        Patient has no prior history of Echocardiogram examinations.                  COPD; Risk Factors:Hypertension, Diabetes, Dyslipidemia and                 Former Smoker.  Sonographer:    Tinnie Barefoot RDCS Referring Phys: [id number] SARA-MAIZ A THOMAS IMPRESSIONS  1. Left ventricular ejection fraction, by estimation, is 65 to 70%. The left ventricle has normal function. The left ventricle has no regional wall motion abnormalities. There is mild left ventricular hypertrophy. Left ventricular diastolic parameters are consistent with Grade I diastolic dysfunction (impaired relaxation).  2. Right ventricular systolic function is normal. The right ventricular size is normal. Tricuspid regurgitation signal is inadequate for assessing PA pressure.  3. The mitral valve is degenerative. Trivial mitral valve regurgitation. Moderate mitral annular calcification.  4. The aortic valve was not well visualized. Aortic valve regurgitation is not visualized. No aortic stenosis is present.  5. The inferior vena cava is normal in size with greater than 50% respiratory variability, suggesting right atrial pressure of 3 mmHg. FINDINGS  Left Ventricle: Left ventricular ejection fraction, by estimation, is 65 to 70%. The left ventricle has normal function. The left ventricle has no regional wall motion abnormalities. The left ventricular internal cavity size was normal in size. There is  mild left ventricular hypertrophy. Left ventricular diastolic parameters are consistent with Grade I diastolic dysfunction (impaired relaxation). Right Ventricle: The right ventricular size is normal. No increase in right ventricular wall thickness. Right ventricular systolic function is normal. Tricuspid regurgitation signal is inadequate for assessing PA pressure. Left Atrium: Left atrial size was normal in size. Right Atrium: Right atrial size was normal in size. Pericardium: There is no evidence of  pericardial effusion. Mitral Valve: The mitral valve is degenerative in appearance. Moderate mitral annular calcification.  Trivial mitral valve regurgitation. Tricuspid Valve: The tricuspid valve is normal in structure. Tricuspid valve regurgitation is trivial. Aortic Valve: The aortic valve was not well visualized. Aortic valve regurgitation is not visualized. No aortic stenosis is present. Pulmonic Valve: The pulmonic valve was not well visualized. Pulmonic valve regurgitation is trivial. Aorta: The aortic root and ascending aorta are structurally normal, with no evidence of dilitation. Venous: The inferior vena cava is normal in size with greater than 50% respiratory variability, suggesting right atrial pressure of 3 mmHg. IAS/Shunts: The interatrial septum was not well visualized.  LEFT VENTRICLE PLAX 2D LVIDd:         4.30 cm   Diastology LVIDs:         2.30 cm   LV e' medial:    7.18 cm/s LV PW:         1.20 cm   LV E/e' medial:  10.9 LV IVS:        0.90 cm   LV e' lateral:   8.59 cm/s LVOT diam:     2.00 cm   LV E/e' lateral: 9.1 LV SV:         79 LV SV Index:   41 LVOT Area:     3.14 cm  RIGHT VENTRICLE             IVC RV Basal diam:  2.60 cm     IVC diam: 1.40 cm RV S prime:     11.90 cm/s TAPSE (M-mode): 1.6 cm LEFT ATRIUM             Index        RIGHT ATRIUM           Index LA diam:        4.00 cm 2.06 cm/m   RA Area:     12.90 cm LA Vol (A2C):   45.0 ml 23.23 ml/m  RA Volume:   29.40 ml  15.18 ml/m LA Vol (A4C):   44.2 ml 22.82 ml/m LA Biplane Vol: 44.6 ml 23.02 ml/m  AORTIC VALVE LVOT Vmax:   112.00 cm/s LVOT Vmean:  75.800 cm/s LVOT VTI:    0.252 m  AORTA Ao Root diam: 3.00 cm Ao Asc diam:  3.30 cm MITRAL VALVE MV Area (PHT): 3.42 cm     SHUNTS MV Decel Time: 222 msec     Systemic VTI:  0.25 m MV E velocity: 78.50 cm/s   Systemic Diam: 2.00 cm MV A velocity: 127.00 cm/s MV E/A ratio:  0.62 Lonni Nanas MD Electronically signed by Lonni Nanas MD Signature Date/Time: 06/30/2024/3:57:56 PM    Final    CT Angio Chest PE W and/or Wo Contrast Result Date: 06/29/2024 EXAM: CTA CHEST 06/29/2024  07:14:29 PM TECHNIQUE: CTA of the chest was performed without and with the administration of 75 mL of intravenous iohexol  (OMNIPAQUE ) 350 MG/ML injection. Multiplanar reformatted images are provided for review. MIP images are provided for review. Automated exposure control, iterative reconstruction, and/or weight based adjustment of the mA/kV was utilized to reduce the radiation dose to as low as reasonably achievable. COMPARISON: None available. CLINICAL HISTORY: Chest pain. LOC. Pulmonary embolism (PE) suspected, high prob. FINDINGS: PULMONARY ARTERIES: Pulmonary arteries are adequately opacified for evaluation. No acute pulmonary embolus. Main pulmonary artery is normal in caliber. MEDIASTINUM: The heart and pericardium demonstrate no acute abnormality. No thoracic aortic aneurysm or dissection. Thoracic aortic atherosclerosis. LYMPH  NODES: No mediastinal, hilar or axillary lymphadenopathy. LUNGS AND PLEURA: Mild centrilobular emphysematous changes. Faint perifissural nodules along the right major fissure (image 69) reflect benign subpleural lymph nodes; no follow-up is recommended. Mild lingular atelectasis. No focal consolidation or pulmonary edema. No evidence of pleural effusion or pneumothorax. UPPER ABDOMEN: Limited images of the upper abdomen are unremarkable. SOFT TISSUES AND BONES: Mild degenerative changes of the mid thoracic spine. No acute soft tissue abnormality. IMPRESSION: 1. No pulmonary embolism. 2. No acute findings. Electronically signed by: Pinkie Pebbles MD 06/29/2024 07:33 PM EST RP Workstation: [id number]   DG Chest Port 1 View Result Date: 06/29/2024 EXAM: 1 VIEW(S) XRAY OF THE CHEST 06/29/2024 06:43:00 PM COMPARISON: 08/20/2009 CLINICAL HISTORY: cp loc FINDINGS: LUNGS AND PLEURA: No focal pulmonary opacity. No pleural effusion. No pneumothorax. HEART AND MEDIASTINUM: Thoracic aortic atherosclerosis. No acute abnormality of the cardiac silhouette. BONES AND SOFT TISSUES: No acute  osseous abnormality. IMPRESSION: 1. No acute findings. Electronically signed by: Pinkie Pebbles MD 06/29/2024 07:11 PM EST RP Workstation: [id number]    Cardiac Studies   Echo:  1. Left ventricular ejection fraction, by estimation, is 65 to 70%. The  left ventricle has normal function. The left ventricle has no regional  wall motion abnormalities. There is mild left ventricular hypertrophy.  Left ventricular diastolic parameters  are consistent with Grade I diastolic dysfunction (impaired relaxation).   2. Right ventricular systolic function is normal. The right ventricular  size is normal. Tricuspid regurgitation signal is inadequate for assessing  PA pressure.   3. The mitral valve is degenerative. Trivial mitral valve regurgitation.  Moderate mitral annular calcification.   4. The aortic valve was not well visualized. Aortic valve regurgitation  is not visualized. No aortic stenosis is present.   5. The inferior vena cava is normal in size with greater than 50%  respiratory variability, suggesting right atrial pressure of 3 mmHg.   Patient Profile     78 y.o. female  known to have COPD/asthma, HTN, DM 2, psoriasis, presented to the ER with chest pain.   Assessment & Plan    Chest pain:  No clear cardiac etiology.  She had had previous work up.  Echo without acute abnormalities.  No further work up.    AMS:  Tele reviewed.  Primary team was considering head CT.  I don't suspect an arrhythmia.  No further work up.    HTN:  BP is elevated.  I have suggested PRN hydral to go home with the patient for episodic SBP greater than 170.  This might be particularly useful since she gets headaches with her increased BP and given the fact that some of the BP spikes are stress induced.     For questions or updates, please contact [facility] Please consult [email or web address] for contact info under Cardiology/STEMI.   Signed, Lynwood Schilling, MD  07/01/2024, 11:43 AM

## 2024-07-10 NOTE — Progress Notes (Deleted)
  Cardiology Office Note   Date:  07/10/2024  ID:  Sandra Burnett, Sandra Burnett [birth year], MRN [id number] PCP: Fernande Ophelia JINNY DOUGLAS, MD  [facility] Providers Cardiologist:  None   History of Present Illness [name] is a 78 y.o. female with a PMH significant for COPD on oxygen , asthma, DM type 2, cirrhosis, HTN, HLD here for follow-up appointment.  Patient was admitted with chest pain recently and the chest pain was considered atypical.  Admitted for further assessment and management.  Troponins came back negative.  Cardiology was consulted at this time.  Echocardiogram was obtained.  EKG was not suggestive of acute coronary syndrome.  Chest pain and syncope had resolved but at the time of discharge.  Today, she ***  ROS: ***  Studies Reviewed      CT angio chest revealed: 1. No pulmonary embolism. 2. No acute findings.   Echo revealed: 1. Left ventricular ejection fraction, by estimation, is 65 to 70%. The  left ventricle has normal function. The left ventricle has no regional  wall motion abnormalities. There is mild left ventricular hypertrophy.  Left ventricular diastolic parameters  are consistent with Grade I diastolic dysfunction (impaired relaxation).   2. Right ventricular systolic function is normal. The right ventricular  size is normal. Tricuspid regurgitation signal is inadequate for assessing  PA pressure.   3. The mitral valve is degenerative. Trivial mitral valve regurgitation.  Moderate mitral annular calcification.   4. The aortic valve was not well visualized. Aortic valve regurgitation  is not visualized. No aortic stenosis is present.   5. The inferior vena cava is normal in size with greater than 50%  respiratory variability, suggesting right atrial pressure of 3 mmHg.   Risk Assessment/Calculations {Does this patient have ATRIAL FIBRILLATION?[phone]} No BP recorded.  {Refresh Note OR Click here to enter BP  :1}***       Physical Exam VS:  There  were no vitals taken for this visit.       Wt Readings from Last 3 Encounters:  06/30/24 182 lb 5.1 oz (82.7 kg)  12/24/22 190 lb 6.4 oz (86.4 kg)  12/22/21 181 lb 4.8 oz (82.2 kg)    GEN: Well nourished, well developed in no acute distress NECK: No JVD; No carotid bruits CARDIAC: ***RRR, no murmurs, rubs, gallops RESPIRATORY:  Clear to auscultation without rales, wheezing or rhonchi  ABDOMEN: Soft, non-tender, non-distended EXTREMITIES:  No edema; No deformity   ASSESSMENT AND PLAN Chest pain Asthma Type 2 DM Psoriasis Obesity class I     {Are you ordering a CV Procedure (e.g. stress test, cath, DCCV, TEE, etc)?   Press F2        :[id number]}  Dispo: ***  Signed, Sandra LOISE Fabry, PA-C

## 2024-07-11 ENCOUNTER — Ambulatory Visit: Admitting: Physician Assistant

## 2024-07-11 DIAGNOSIS — J45909 Unspecified asthma, uncomplicated: Secondary | ICD-10-CM

## 2024-07-11 DIAGNOSIS — R079 Chest pain, unspecified: Secondary | ICD-10-CM

## 2024-07-11 DIAGNOSIS — L409 Psoriasis, unspecified: Secondary | ICD-10-CM

## 2024-07-16 ENCOUNTER — Ambulatory Visit: Admitting: Physician Assistant

## 2024-07-18 ENCOUNTER — Ambulatory Visit
Admission: RE | Admit: 2024-07-18 | Discharge: 2024-07-18 | Disposition: A | Source: Ambulatory Visit | Attending: Internal Medicine | Admitting: Internal Medicine

## 2024-07-18 DIAGNOSIS — Z1231 Encounter for screening mammogram for malignant neoplasm of breast: Secondary | ICD-10-CM | POA: Insufficient documentation
